# Patient Record
Sex: Female | Born: 1935 | Race: White | Hispanic: No | Marital: Married | Smoking: Never smoker
Health system: Southern US, Community
[De-identification: ages and names within clinical notes are randomized; demographics above are authoritative.]

## PROBLEM LIST (undated history)

## (undated) DIAGNOSIS — I38 Endocarditis, valve unspecified: Secondary | ICD-10-CM

## (undated) DIAGNOSIS — I10 Essential (primary) hypertension: Secondary | ICD-10-CM

## (undated) HISTORY — PX: CATARACT EXTRACTION: SUR2

## (undated) HISTORY — PX: ROTATOR CUFF REPAIR: SHX139

## (undated) HISTORY — PX: BLADDER SURGERY: SHX569

## (undated) HISTORY — PX: LEG SURGERY: SHX1003

## (undated) HISTORY — PX: ABDOMINAL HYSTERECTOMY: SHX81

---

## 1998-06-29 ENCOUNTER — Other Ambulatory Visit: Admission: RE | Admit: 1998-06-29 | Discharge: 1998-06-29 | Payer: Self-pay | Admitting: Gynecology

## 1999-08-21 ENCOUNTER — Other Ambulatory Visit: Admission: RE | Admit: 1999-08-21 | Discharge: 1999-08-21 | Payer: Self-pay | Admitting: Gynecology

## 2000-07-05 ENCOUNTER — Encounter: Payer: Self-pay | Admitting: Orthopedic Surgery

## 2000-07-05 ENCOUNTER — Encounter: Admission: RE | Admit: 2000-07-05 | Discharge: 2000-07-05 | Payer: Self-pay | Admitting: Orthopedic Surgery

## 2000-07-30 ENCOUNTER — Ambulatory Visit (HOSPITAL_BASED_OUTPATIENT_CLINIC_OR_DEPARTMENT_OTHER): Admission: RE | Admit: 2000-07-30 | Discharge: 2000-07-31 | Payer: Self-pay | Admitting: Orthopedic Surgery

## 2000-12-16 ENCOUNTER — Ambulatory Visit (HOSPITAL_BASED_OUTPATIENT_CLINIC_OR_DEPARTMENT_OTHER): Admission: RE | Admit: 2000-12-16 | Discharge: 2000-12-16 | Payer: Self-pay | Admitting: Orthopedic Surgery

## 2001-08-21 ENCOUNTER — Other Ambulatory Visit: Admission: RE | Admit: 2001-08-21 | Discharge: 2001-08-21 | Payer: Self-pay | Admitting: Gynecology

## 2013-11-24 DIAGNOSIS — H8110 Benign paroxysmal vertigo, unspecified ear: Secondary | ICD-10-CM | POA: Diagnosis not present

## 2013-11-24 DIAGNOSIS — H6120 Impacted cerumen, unspecified ear: Secondary | ICD-10-CM | POA: Diagnosis not present

## 2013-11-24 DIAGNOSIS — I10 Essential (primary) hypertension: Secondary | ICD-10-CM | POA: Diagnosis not present

## 2013-12-23 DIAGNOSIS — I059 Rheumatic mitral valve disease, unspecified: Secondary | ICD-10-CM | POA: Diagnosis not present

## 2014-03-08 DIAGNOSIS — L821 Other seborrheic keratosis: Secondary | ICD-10-CM | POA: Diagnosis not present

## 2014-03-24 DIAGNOSIS — M999 Biomechanical lesion, unspecified: Secondary | ICD-10-CM | POA: Diagnosis not present

## 2014-03-24 DIAGNOSIS — I10 Essential (primary) hypertension: Secondary | ICD-10-CM | POA: Diagnosis not present

## 2014-03-24 DIAGNOSIS — IMO0002 Reserved for concepts with insufficient information to code with codable children: Secondary | ICD-10-CM | POA: Diagnosis not present

## 2014-03-24 DIAGNOSIS — M542 Cervicalgia: Secondary | ICD-10-CM | POA: Diagnosis not present

## 2014-03-24 DIAGNOSIS — E782 Mixed hyperlipidemia: Secondary | ICD-10-CM | POA: Diagnosis not present

## 2014-03-25 DIAGNOSIS — IMO0002 Reserved for concepts with insufficient information to code with codable children: Secondary | ICD-10-CM | POA: Diagnosis not present

## 2014-03-25 DIAGNOSIS — M999 Biomechanical lesion, unspecified: Secondary | ICD-10-CM | POA: Diagnosis not present

## 2014-03-30 DIAGNOSIS — M999 Biomechanical lesion, unspecified: Secondary | ICD-10-CM | POA: Diagnosis not present

## 2014-03-30 DIAGNOSIS — IMO0002 Reserved for concepts with insufficient information to code with codable children: Secondary | ICD-10-CM | POA: Diagnosis not present

## 2014-04-01 DIAGNOSIS — M999 Biomechanical lesion, unspecified: Secondary | ICD-10-CM | POA: Diagnosis not present

## 2014-04-01 DIAGNOSIS — IMO0002 Reserved for concepts with insufficient information to code with codable children: Secondary | ICD-10-CM | POA: Diagnosis not present

## 2014-04-06 DIAGNOSIS — IMO0002 Reserved for concepts with insufficient information to code with codable children: Secondary | ICD-10-CM | POA: Diagnosis not present

## 2014-04-06 DIAGNOSIS — M999 Biomechanical lesion, unspecified: Secondary | ICD-10-CM | POA: Diagnosis not present

## 2014-04-20 DIAGNOSIS — M62838 Other muscle spasm: Secondary | ICD-10-CM | POA: Diagnosis not present

## 2014-04-20 DIAGNOSIS — M542 Cervicalgia: Secondary | ICD-10-CM | POA: Diagnosis not present

## 2014-09-26 DIAGNOSIS — Z23 Encounter for immunization: Secondary | ICD-10-CM | POA: Diagnosis not present

## 2014-09-27 DIAGNOSIS — R002 Palpitations: Secondary | ICD-10-CM | POA: Diagnosis not present

## 2014-09-27 DIAGNOSIS — I340 Nonrheumatic mitral (valve) insufficiency: Secondary | ICD-10-CM | POA: Diagnosis not present

## 2014-09-27 DIAGNOSIS — H903 Sensorineural hearing loss, bilateral: Secondary | ICD-10-CM | POA: Diagnosis not present

## 2014-09-27 DIAGNOSIS — E785 Hyperlipidemia, unspecified: Secondary | ICD-10-CM | POA: Diagnosis not present

## 2014-11-03 DIAGNOSIS — E785 Hyperlipidemia, unspecified: Secondary | ICD-10-CM | POA: Diagnosis not present

## 2014-11-03 DIAGNOSIS — Z1211 Encounter for screening for malignant neoplasm of colon: Secondary | ICD-10-CM | POA: Diagnosis not present

## 2014-11-03 DIAGNOSIS — Z1239 Encounter for other screening for malignant neoplasm of breast: Secondary | ICD-10-CM | POA: Diagnosis not present

## 2014-11-03 DIAGNOSIS — R030 Elevated blood-pressure reading, without diagnosis of hypertension: Secondary | ICD-10-CM | POA: Diagnosis not present

## 2014-11-03 DIAGNOSIS — H9201 Otalgia, right ear: Secondary | ICD-10-CM | POA: Diagnosis not present

## 2014-11-03 DIAGNOSIS — E871 Hypo-osmolality and hyponatremia: Secondary | ICD-10-CM | POA: Diagnosis not present

## 2014-11-03 DIAGNOSIS — I340 Nonrheumatic mitral (valve) insufficiency: Secondary | ICD-10-CM | POA: Diagnosis not present

## 2014-11-03 DIAGNOSIS — M8580 Other specified disorders of bone density and structure, unspecified site: Secondary | ICD-10-CM | POA: Diagnosis not present

## 2015-03-15 DIAGNOSIS — L821 Other seborrheic keratosis: Secondary | ICD-10-CM | POA: Diagnosis not present

## 2015-06-29 DIAGNOSIS — H6092 Unspecified otitis externa, left ear: Secondary | ICD-10-CM | POA: Diagnosis not present

## 2015-09-01 DIAGNOSIS — Z1231 Encounter for screening mammogram for malignant neoplasm of breast: Secondary | ICD-10-CM | POA: Diagnosis not present

## 2015-09-01 DIAGNOSIS — Z780 Asymptomatic menopausal state: Secondary | ICD-10-CM | POA: Diagnosis not present

## 2015-09-01 DIAGNOSIS — M810 Age-related osteoporosis without current pathological fracture: Secondary | ICD-10-CM | POA: Diagnosis not present

## 2015-09-14 DIAGNOSIS — R5383 Other fatigue: Secondary | ICD-10-CM | POA: Diagnosis not present

## 2015-09-14 DIAGNOSIS — E785 Hyperlipidemia, unspecified: Secondary | ICD-10-CM | POA: Diagnosis not present

## 2015-09-14 DIAGNOSIS — R946 Abnormal results of thyroid function studies: Secondary | ICD-10-CM | POA: Diagnosis not present

## 2015-09-14 DIAGNOSIS — Z23 Encounter for immunization: Secondary | ICD-10-CM | POA: Diagnosis not present

## 2015-09-14 DIAGNOSIS — M545 Low back pain: Secondary | ICD-10-CM | POA: Diagnosis not present

## 2015-09-14 DIAGNOSIS — R739 Hyperglycemia, unspecified: Secondary | ICD-10-CM | POA: Diagnosis not present

## 2015-09-14 DIAGNOSIS — Z0000 Encounter for general adult medical examination without abnormal findings: Secondary | ICD-10-CM | POA: Diagnosis not present

## 2015-09-28 DIAGNOSIS — Z23 Encounter for immunization: Secondary | ICD-10-CM | POA: Diagnosis not present

## 2015-11-29 DIAGNOSIS — J019 Acute sinusitis, unspecified: Secondary | ICD-10-CM | POA: Diagnosis not present

## 2015-12-17 DIAGNOSIS — N390 Urinary tract infection, site not specified: Secondary | ICD-10-CM | POA: Diagnosis not present

## 2016-03-14 DIAGNOSIS — R7301 Impaired fasting glucose: Secondary | ICD-10-CM | POA: Diagnosis not present

## 2016-03-14 DIAGNOSIS — J309 Allergic rhinitis, unspecified: Secondary | ICD-10-CM | POA: Diagnosis not present

## 2016-03-14 DIAGNOSIS — E785 Hyperlipidemia, unspecified: Secondary | ICD-10-CM | POA: Diagnosis not present

## 2016-04-20 DIAGNOSIS — J069 Acute upper respiratory infection, unspecified: Secondary | ICD-10-CM | POA: Diagnosis not present

## 2016-04-20 DIAGNOSIS — B9789 Other viral agents as the cause of diseases classified elsewhere: Secondary | ICD-10-CM | POA: Diagnosis not present

## 2016-06-21 DIAGNOSIS — H35353 Cystoid macular degeneration, bilateral: Secondary | ICD-10-CM | POA: Diagnosis not present

## 2016-06-21 DIAGNOSIS — Z961 Presence of intraocular lens: Secondary | ICD-10-CM | POA: Diagnosis not present

## 2016-06-21 DIAGNOSIS — H5201 Hypermetropia, right eye: Secondary | ICD-10-CM | POA: Diagnosis not present

## 2016-06-21 DIAGNOSIS — H524 Presbyopia: Secondary | ICD-10-CM | POA: Diagnosis not present

## 2016-06-21 DIAGNOSIS — H52223 Regular astigmatism, bilateral: Secondary | ICD-10-CM | POA: Diagnosis not present

## 2016-09-13 DIAGNOSIS — R5383 Other fatigue: Secondary | ICD-10-CM | POA: Diagnosis not present

## 2016-09-13 DIAGNOSIS — Z0000 Encounter for general adult medical examination without abnormal findings: Secondary | ICD-10-CM | POA: Diagnosis not present

## 2016-09-13 DIAGNOSIS — Z23 Encounter for immunization: Secondary | ICD-10-CM | POA: Diagnosis not present

## 2017-05-06 DIAGNOSIS — I10 Essential (primary) hypertension: Secondary | ICD-10-CM | POA: Diagnosis not present

## 2017-05-07 DIAGNOSIS — L668 Other cicatricial alopecia: Secondary | ICD-10-CM | POA: Diagnosis not present

## 2017-05-07 DIAGNOSIS — L821 Other seborrheic keratosis: Secondary | ICD-10-CM | POA: Diagnosis not present

## 2017-05-07 DIAGNOSIS — D1801 Hemangioma of skin and subcutaneous tissue: Secondary | ICD-10-CM | POA: Diagnosis not present

## 2017-05-09 DIAGNOSIS — J3089 Other allergic rhinitis: Secondary | ICD-10-CM | POA: Diagnosis not present

## 2017-05-09 DIAGNOSIS — E7800 Pure hypercholesterolemia, unspecified: Secondary | ICD-10-CM | POA: Diagnosis not present

## 2017-11-05 DIAGNOSIS — Z23 Encounter for immunization: Secondary | ICD-10-CM | POA: Diagnosis not present

## 2018-10-18 ENCOUNTER — Encounter (HOSPITAL_COMMUNITY): Payer: Self-pay | Admitting: Emergency Medicine

## 2018-10-18 ENCOUNTER — Other Ambulatory Visit: Payer: Self-pay

## 2018-10-18 ENCOUNTER — Emergency Department (HOSPITAL_COMMUNITY): Payer: Medicare Other

## 2018-10-18 ENCOUNTER — Inpatient Hospital Stay (HOSPITAL_COMMUNITY)
Admission: EM | Admit: 2018-10-18 | Discharge: 2018-10-21 | DRG: 603 | Disposition: A | Discharge status: Home / Self Care | Payer: Medicare Other | Attending: Nephrology | Admitting: Nephrology

## 2018-10-18 DIAGNOSIS — L0231 Cutaneous abscess of buttock: Secondary | ICD-10-CM | POA: Diagnosis present

## 2018-10-18 DIAGNOSIS — M549 Dorsalgia, unspecified: Secondary | ICD-10-CM | POA: Diagnosis present

## 2018-10-18 DIAGNOSIS — Z885 Allergy status to narcotic agent status: Secondary | ICD-10-CM | POA: Diagnosis not present

## 2018-10-18 DIAGNOSIS — I10 Essential (primary) hypertension: Secondary | ICD-10-CM | POA: Diagnosis present

## 2018-10-18 DIAGNOSIS — E860 Dehydration: Secondary | ICD-10-CM | POA: Diagnosis present

## 2018-10-18 DIAGNOSIS — E871 Hypo-osmolality and hyponatremia: Secondary | ICD-10-CM | POA: Diagnosis present

## 2018-10-18 DIAGNOSIS — Z79899 Other long term (current) drug therapy: Secondary | ICD-10-CM

## 2018-10-18 DIAGNOSIS — L88 Pyoderma gangrenosum: Secondary | ICD-10-CM | POA: Diagnosis present

## 2018-10-18 DIAGNOSIS — L03317 Cellulitis of buttock: Secondary | ICD-10-CM | POA: Diagnosis present

## 2018-10-18 DIAGNOSIS — Z881 Allergy status to other antibiotic agents status: Secondary | ICD-10-CM | POA: Diagnosis not present

## 2018-10-18 DIAGNOSIS — Z91048 Other nonmedicinal substance allergy status: Secondary | ICD-10-CM

## 2018-10-18 DIAGNOSIS — M1990 Unspecified osteoarthritis, unspecified site: Secondary | ICD-10-CM | POA: Diagnosis present

## 2018-10-18 DIAGNOSIS — Z882 Allergy status to sulfonamides status: Secondary | ICD-10-CM

## 2018-10-18 DIAGNOSIS — R5381 Other malaise: Secondary | ICD-10-CM | POA: Diagnosis not present

## 2018-10-18 HISTORY — DX: Endocarditis, valve unspecified: I38

## 2018-10-18 HISTORY — DX: Essential (primary) hypertension: I10

## 2018-10-18 LAB — BASIC METABOLIC PANEL
Anion gap: 11 (ref 5–15)
BUN: 15 mg/dL (ref 8–23)
CO2: 22 mmol/L (ref 22–32)
Calcium: 9.4 mg/dL (ref 8.9–10.3)
Chloride: 97 mmol/L — ABNORMAL LOW (ref 98–111)
Creatinine, Ser: 0.83 mg/dL (ref 0.44–1.00)
GFR calc Af Amer: >60 mL/min (ref >60)
GFR calc non Af Amer: >60 mL/min (ref >60)
Glucose, Bld: 116 mg/dL — ABNORMAL HIGH (ref 70–99)
Potassium: 4.1 mmol/L (ref 3.5–5.1)
Sodium: 130 mmol/L — ABNORMAL LOW (ref 135–145)

## 2018-10-18 LAB — CBC WITH DIFFERENTIAL/PLATELET
Abs Immature Granulocytes: 0.07 10*3/uL (ref 0.00–0.07)
Basophils Absolute: 0 10*3/uL (ref 0.0–0.1)
Basophils Relative: 0 %
Eosinophils Absolute: 0.2 10*3/uL (ref 0.0–0.5)
Eosinophils Relative: 2 %
HCT: 36 % (ref 36.0–46.0)
Hemoglobin: 11.8 g/dL — ABNORMAL LOW (ref 12.0–15.0)
Immature Granulocytes: 1 %
Lymphocytes Relative: 19 %
Lymphs Abs: 1.7 10*3/uL (ref 0.7–4.0)
MCH: 29.8 pg (ref 26.0–34.0)
MCHC: 32.8 g/dL (ref 30.0–36.0)
MCV: 90.9 fL (ref 80.0–100.0)
Monocytes Absolute: 1.3 10*3/uL — ABNORMAL HIGH (ref 0.1–1.0)
Monocytes Relative: 15 %
Neutro Abs: 5.7 10*3/uL (ref 1.7–7.7)
Neutrophils Relative %: 63 %
Platelets: 299 10*3/uL (ref 150–400)
RBC: 3.96 MIL/uL (ref 3.87–5.11)
RDW: 13.4 % (ref 11.5–15.5)
WBC: 9 10*3/uL (ref 4.0–10.5)
nRBC: 0 % (ref 0.0–0.2)

## 2018-10-18 LAB — I-STAT CHEM 8, ED
BUN: 16 mg/dL (ref 8–23)
CREATININE: 0.7 mg/dL (ref 0.44–1.00)
Calcium, Ion: 1.25 mmol/L (ref 1.15–1.40)
Chloride: 99 mmol/L (ref 98–111)
Glucose, Bld: 115 mg/dL — ABNORMAL HIGH (ref 70–99)
HCT: 36 % (ref 36.0–46.0)
Hemoglobin: 12.2 g/dL (ref 12.0–15.0)
Potassium: 4.1 mmol/L (ref 3.5–5.1)
Sodium: 130 mmol/L — ABNORMAL LOW (ref 135–145)
TCO2: 24 mmol/L (ref 22–32)

## 2018-10-18 LAB — I-STAT CG4 LACTIC ACID, ED: Lactic Acid, Venous: 1.25 mmol/L (ref 0.5–1.9)

## 2018-10-18 MED ORDER — ACETAMINOPHEN 500 MG PO TABS
1000.0000 mg | ORAL_TABLET | Freq: Four times a day (QID) | ORAL | Status: DC | PRN
  Administered 2018-10-19 – 2018-10-21 (×3): 1000 mg via ORAL
  Filled 2018-10-18 (×3): qty 2

## 2018-10-18 MED ORDER — SODIUM CHLORIDE 0.9 % IV SOLN
1.0000 g | Freq: Once | INTRAVENOUS | Status: AC
  Administered 2018-10-18: 1 g via INTRAVENOUS
  Filled 2018-10-18: qty 10

## 2018-10-18 MED ORDER — VITAMIN E 180 MG (400 UNIT) PO CAPS
400.0000 [IU] | ORAL_CAPSULE | Freq: Every day | ORAL | Status: DC
  Administered 2018-10-18 – 2018-10-21 (×4): 400 [IU] via ORAL
  Filled 2018-10-18 (×4): qty 1

## 2018-10-18 MED ORDER — DICLOFENAC SODIUM 75 MG PO TBEC
75.0000 mg | DELAYED_RELEASE_TABLET | Freq: Every day | ORAL | Status: DC | PRN
  Administered 2018-10-18: 75 mg via ORAL
  Filled 2018-10-18 (×2): qty 1

## 2018-10-18 MED ORDER — LISINOPRIL 10 MG PO TABS
10.0000 mg | ORAL_TABLET | Freq: Every day | ORAL | Status: DC
  Administered 2018-10-19: 10 mg via ORAL
  Filled 2018-10-18 (×2): qty 1

## 2018-10-18 MED ORDER — VANCOMYCIN HCL 10 G IV SOLR
1250.0000 mg | Freq: Once | INTRAVENOUS | Status: AC
  Administered 2018-10-18: 1250 mg via INTRAVENOUS
  Filled 2018-10-18: qty 1250

## 2018-10-18 MED ORDER — ONDANSETRON HCL 4 MG/2ML IJ SOLN: 4.0000 mg | Freq: Four times a day (QID) | INTRAMUSCULAR | Status: DC | PRN

## 2018-10-18 MED ORDER — LORATADINE 10 MG PO TABS
10.0000 mg | ORAL_TABLET | Freq: Every day | ORAL | Status: DC
  Administered 2018-10-19 – 2018-10-21 (×3): 10 mg via ORAL
  Filled 2018-10-18 (×3): qty 1

## 2018-10-18 MED ORDER — SODIUM CHLORIDE 0.9 % IV SOLN
INTRAVENOUS | Status: DC
  Administered 2018-10-18 – 2018-10-21 (×4): via INTRAVENOUS

## 2018-10-18 MED ORDER — IBUPROFEN 400 MG PO TABS: 600.0000 mg | ORAL_TABLET | Freq: Four times a day (QID) | ORAL | Status: DC | PRN

## 2018-10-18 MED ORDER — ENOXAPARIN SODIUM 40 MG/0.4ML ~~LOC~~ SOLN
40.0000 mg | SUBCUTANEOUS | Status: DC
  Administered 2018-10-18 – 2018-10-20 (×3): 40 mg via SUBCUTANEOUS
  Filled 2018-10-18 (×3): qty 0.4

## 2018-10-18 MED ORDER — ONDANSETRON HCL 4 MG PO TABS: 4.0000 mg | ORAL_TABLET | Freq: Four times a day (QID) | ORAL | Status: DC | PRN

## 2018-10-18 MED ORDER — IOHEXOL 300 MG/ML  SOLN
100.0000 mL | Freq: Once | INTRAMUSCULAR | Status: AC | PRN
  Administered 2018-10-18: 100 mL via INTRAVENOUS

## 2018-10-18 MED ORDER — CALCIUM CARBONATE-VITAMIN D 500-200 MG-UNIT PO TABS
ORAL_TABLET | Freq: Two times a day (BID) | ORAL | Status: DC
  Administered 2018-10-18 – 2018-10-21 (×6): 1 via ORAL
  Filled 2018-10-18 (×6): qty 1

## 2018-10-18 NOTE — H&P (Signed)
History and Physical   Raven Sampson TLX:[id number] DOB: 1932-04-23 DOA: 10/18/2018  Referring MD/NP/PA: Dr. Tyrone Nine  PCP: Bayard Beaver, PA-C   Patient coming from: Home  Chief Complaint: Rashes at the buttocks  HPI: Raven Sampson is a 84 y.o. female with medical history significant of hypertension who started seeing some rashes in her buttocks for about a month now.  They have been progressively getting worse lately.  Patient went to the urgent care center today where she was seen and evaluated and sent to the ER with suspected cellulitis.  She was seen in the ER and evaluated.  Work-up so far showed no abscess.  Cellulitis suspected and patient is being admitted for IV antibiotics.  Has pain on and off.  It gets worse when she sits down but not relieved by anything.  No sick contacts.  No prior skin lesions or infections.  Has wound that has opened and oozing at the moment  ED Course: Temperature 97.7 blood pressure 140/82 pulse 105 respirate of 18 oxygen sat 97% room air.  White count is 9.0 hemoglobin 12.2 with platelets 299.  Sodium is 130 potassium 4.0 chloride 99 and glucose 115 lactic acid 1.25.  CT abdomen pelvis showed No evidence of anorectal abscess. Small subcutaneous fluid collection adjacent the midline over the left lower gluteal crease measuring 1.0 x 2.2 x 2.3 cm. This may represent a small subcutaneous abscess versus noninfected fluid collection.  Patient initiated on vancomycin and Rocephin as being admitted for treat  Mild colonic diverticulosis.  Review of Systems: As per HPI otherwise 10 point review of systems negative.    Past Medical History:  Diagnosis Date  . Hypertension   . Leaky heart valve     Past Surgical History:  Procedure Laterality Date  . ABDOMINAL HYSTERECTOMY    . BLADDER SURGERY    . CATARACT EXTRACTION    . LEG SURGERY Left   . ROTATOR CUFF REPAIR Right      reports that she has never smoked. She has never used smokeless tobacco. She  reports that she drank alcohol. She reports that she has current or past drug history.  Allergies  Allergen Reactions  . Codeine Nausea And Vomiting and Other (See Comments)    dizziness  . Tape Other (See Comments)    Burning sensation - pls use paper tape  . Ciprofloxacin Rash  . Sulfa Antibiotics Nausea And Vomiting and Rash    No family history on file.   Prior to Admission medications   Medication Sig Start Date End Date Taking? Authorizing Provider  acetaminophen (TYLENOL) 500 MG tablet Take 1,000 mg by mouth every 6 (six) hours as needed for headache (pain).   Yes [provider]  Calcium Carb-Cholecalciferol (CALCIUM 600-D PO) Take 600 mg by mouth 2 (two) times daily.   Yes [provider]  Coenzyme Q10 (COQ10 PO) Take 1 capsule by mouth daily.   Yes [provider]  diclofenac (VOLTAREN) 75 MG EC tablet Take 75 mg by mouth daily as needed (pain).   Yes [provider]  fexofenadine (ALLEGRA) 180 MG tablet Take 180 mg by mouth daily.   Yes [provider]  fluocinolone (SYNALAR) 0.01 % external solution Apply 1 application topically See admin instructions. Apply topically to scalp once daily for two weeks, hold for two weeks and then repeat   Yes [provider]  Glucosamine-Chondroitin (OSTEO BI-FLEX REGULAR STRENGTH PO) Take 1 tablet by mouth 2 (two) times daily.  Yes [provider]  lisinopril (PRINIVIL,ZESTRIL) 10 MG tablet Take 10 mg by mouth daily.   Yes [provider]  Polyvinyl Alcohol-Povidone (REFRESH OP) Place 1 drop into both eyes daily.   Yes [provider]  vitamin E 400 UNIT capsule Take 400 Units by mouth daily.   Yes [provider]  PREDNISONE PO Take 1-6 tablets by mouth See admin instructions. Take 6 tablets by mouth 1st day, then take 5 tablets 2nd day, then take 4 tablets 3rd day, then take 3 tablets 4th day, then take 2 tablets 5th day, then take 1 tablet 6th day,  then stop    [provider]    Physical Exam: Vitals:   10/18/18 1816 10/18/18 1819 10/18/18 1930 10/18/18 2047  BP: (!) 148/82  128/63 (!) 148/69  Pulse: (!) 105  (!) 102 91  Resp: 14  18 16   Temp: 97.7 F (36.5 C)     TempSrc: Oral     SpO2: 97%  98% 98%  Weight:  63 kg    Height:  5' 6.5" (1.689 m)        Constitutional: NAD, calm, comfortable Vitals:   10/18/18 1816 10/18/18 1819 10/18/18 1930 10/18/18 2047  BP: (!) 148/82  128/63 (!) 148/69  Pulse: (!) 105  (!) 102 91  Resp: 14  18 16   Temp: 97.7 F (36.5 C)     TempSrc: Oral     SpO2: 97%  98% 98%  Weight:  63 kg    Height:  5' 6.5" (1.689 m)     Eyes: PERRL, lids and conjunctivae normal ENMT: Mucous membranes are moist. Posterior pharynx clear of any exudate or lesions.Normal dentition.  Neck: normal, supple, no masses, no thyromegaly Respiratory: clear to auscultation bilaterally, no wheezing, no crackles. Normal respiratory effort. No accessory muscle use.  Cardiovascular: Regular rate and rhythm, no murmurs / rubs / gallops. No extremity edema. 2+ pedal pulses. No carotid bruits.  Abdomen: no tenderness, no masses palpated. No hepatosplenomegaly. Bowel sounds positive.  Musculoskeletal: no clubbing / cyanosis. No joint deformity upper and lower extremities. Good ROM, no contractures. Normal muscle tone.  Skin: Bilateral cellulitis of the buttocks around the cleft with right medium as space open wounds oozing serosanguineous fluid Neurologic: CN 2-12 grossly intact. Sensation intact, DTR normal. Strength 5/5 in all 4.  Psychiatric: Normal judgment and insight. Alert and oriented x 3. Normal mood.     Labs on Admission: I have personally reviewed following labs and imaging studies  CBC: Recent Labs  Lab 10/18/18 1910 10/18/18 1925  WBC 9.0  --   NEUTROABS 5.7  --   HGB 11.8* 12.2  HCT 36.0 36.0  MCV 90.9  --   PLT 299  --    Basic Metabolic Panel: Recent Labs  Lab 10/18/18 1910  10/18/18 1925  NA 130* 130*  K 4.1 4.1  CL 97* 99  CO2 22  --   GLUCOSE 116* 115*  BUN 15 16  CREATININE 0.83 0.70  CALCIUM 9.4  --    GFR: Estimated Creatinine Clearance: 50 mL/min (by C-G formula based on SCr of 0.7 mg/dL). Liver Function Tests: No results for input(s): AST, ALT, ALKPHOS, BILITOT, PROT, ALBUMIN in the last 168 hours. No results for input(s): LIPASE, AMYLASE in the last 168 hours. No results for input(s): AMMONIA in the last 168 hours. Coagulation Profile: No results for input(s): INR, PROTIME in the last 168 hours. Cardiac Enzymes: No results for input(s): CKTOTAL, CKMB,  CKMBINDEX, TROPONINI in the last 168 hours. BNP (last 3 results) No results for input(s): PROBNP in the last 8760 hours. HbA1C: No results for input(s): HGBA1C in the last 72 hours. CBG: No results for input(s): GLUCAP in the last 168 hours. Lipid Profile: No results for input(s): CHOL, HDL, LDLCALC, TRIG, CHOLHDL, LDLDIRECT in the last 72 hours. Thyroid Function Tests: No results for input(s): TSH, T4TOTAL, FREET4, T3FREE, THYROIDAB in the last 72 hours. Anemia Panel: No results for input(s): VITAMINB12, FOLATE, FERRITIN, TIBC, IRON, RETICCTPCT in the last 72 hours. Urine analysis: No results found for: COLORURINE, APPEARANCEUR, LABSPEC, PHURINE, GLUCOSEU, HGBUR, BILIRUBINUR, KETONESUR, PROTEINUR, UROBILINOGEN, NITRITE, LEUKOCYTESUR Sepsis Labs: @LABRCNTIP (procalcitonin:4,lacticidven:4) )No results found for this or any previous visit (from the past 240 hour(s)).   Radiological Exams on Admission: Ct Pelvis W Contrast  Result Date: 10/18/2018 CLINICAL DATA:  Anal rectal abscess 1 week. EXAM: CT PELVIS WITH CONTRAST TECHNIQUE: Multidetector CT imaging of the pelvis was performed using the standard protocol following the bolus administration of intravenous contrast. CONTRAST:  166mL OMNIPAQUE IOHEXOL 300 MG/ML  SOLN COMPARISON:  None. FINDINGS: Urinary Tract: Partially visualized 6.0 cm  left renal cyst. Left renal hilum oriented anteriorly. Visualized portions of the ureters and bladder are unremarkable. Bowel: Minimal colonic diverticulosis. Appendix not visualized. Visualized portions of the small bowel are normal. Vascular/Lymphatic: Mild calcified plaque over the distal abdominal aorta. No adenopathy. Reproductive:  Previous hysterectomy. Other: There is a small subcutaneous fluid collection over the lower left gluteal region immediately left of midline below the level of the ischial tuberosities. This measures 1 x 2.2 x 2.3 cm in transverse, AP and craniocaudal dimension. This may represent a subcutaneous infected versus noninfected fluid collection. No evidence of anal rectal abscess. Musculoskeletal: Moderate degenerate change of the lumbosacral spine with significant disc disease over the L3-4, L4-5 and L5-S1 levels. Mild degenerate change of the hips. IMPRESSION: No evidence of anorectal abscess. Small subcutaneous fluid collection adjacent the midline over the left lower gluteal crease measuring 1.0 x 2.2 x 2.3 cm. This may represent a small subcutaneous abscess versus noninfected fluid collection. Mild colonic diverticulosis. Aortic Atherosclerosis (ICD10-I70.0). Partially visualized 6 cm left renal cyst. Electronically Signed   By: Marin Olp M.D.   On: 10/18/2018 20:09    Assessment/Plan Principal Problem:   Cellulitis of buttock Active Problems:   Benign essential HTN   Hyponatremia     #1 cellulitis of buttocks bilaterally: Suspected infection versus inflammation.  Based on findings it looks like infection with some mild fluid collection with initial abscess.  Patient will be admitted and initiated on IV antibiotics.  May get surgical consultation for possible I&D.  Wound culture has been obtained and will wait for [place] sensitivities and identification.  #2 hypertension: Continue lisinopril from home dose  #3 hyponatremia: Euvolemic it appears.  Monitor sodium.   Could be chronic hyponatremia.   DVT prophylaxis: Lovenox Code Status: Full code Family Communication: Daughter and grandson at bedside Disposition Plan: Home Consults called: None Admission status: Observation  Severity of Illness: The appropriate patient status for this patient is OBSERVATION. Observation status is judged to be reasonable and necessary in order to provide the required intensity of service to ensure the patient's safety. The patient's presenting symptoms, physical exam findings, and initial radiographic and laboratory data in the context of their medical condition is felt to place them at decreased risk for further clinical deterioration. Furthermore, it is anticipated that the patient will be medically stable for discharge from the  hospital within 2 midnights of admission. The following factors support the patient status of observation.   " The patient's presenting symptoms include rashes at the buttocks. " The physical exam findings include obvious cellulitis on the back. " The initial radiographic and laboratory data are CT showed no evidence of abscess.     Barbette Merino MD [facility] Pager [phone]  If 7PM-7AM, please contact night-coverage [email or web address] Password [email or web address]  10/18/2018, 9:05 PM

## 2018-10-18 NOTE — ED Provider Notes (Signed)
[facility] EMERGENCY DEPARTMENT Provider Note   CSN: [id number] Arrival date & time: 10/18/18  1810     History   Chief Complaint Chief Complaint  Patient presents with  . Abscess    HPI Raven Sampson is a 84 y.o. female.  84 yo F with a chief complaint of a spot to her buttock.  Is been going on for about a month but slowly getting worse.  She denies injury.  Has not seen a medical care prior to this.  Past week it got significantly worse.  She was at urgent care earlier today and was sent here for evaluation.  The history is provided by the patient and a relative.  Abscess  Location:  Pelvis Pelvic abscess location:  R buttock and L buttock Size:  Quarter Abscess quality: draining, fluctuance, induration, itching, painful, redness, warmth and weeping   Red streaking: no   Duration:  1 month Progression:  Worsening Pain details:    Quality:  Sharp and shooting   Severity:  Moderate   Duration:  1 month   Timing:  Constant   Progression:  Worsening Chronicity:  New Context: not diabetes and not immunosuppression   Relieved by:  Nothing Worsened by:  Nothing Ineffective treatments:  None tried Associated symptoms: no fever, no headaches, no nausea and no vomiting     Past Medical History:  Diagnosis Date  . Hypertension   . Leaky heart valve     Patient Active Problem List   Diagnosis Date Noted  . Cellulitis of buttock 10/18/2018  . Benign essential HTN 10/18/2018  . Hyponatremia 10/18/2018    Past Surgical History:  Procedure Laterality Date  . ABDOMINAL HYSTERECTOMY    . BLADDER SURGERY    . CATARACT EXTRACTION    . LEG SURGERY Left   . ROTATOR CUFF REPAIR Right      OB History   None      Home Medications    Prior to Admission medications   Medication Sig Start Date End Date Taking? Authorizing Provider  acetaminophen (TYLENOL) 500 MG tablet Take 1,000 mg by mouth every 6 (six) hours as needed for headache (pain).    Yes [provider]  Calcium Carb-Cholecalciferol (CALCIUM 600-D PO) Take 600 mg by mouth 2 (two) times daily.   Yes [provider]  Coenzyme Q10 (COQ10 PO) Take 1 capsule by mouth daily.   Yes [provider]  diclofenac (VOLTAREN) 75 MG EC tablet Take 75 mg by mouth daily as needed (pain).   Yes [provider]  fexofenadine (ALLEGRA) 180 MG tablet Take 180 mg by mouth daily.   Yes [provider]  fluocinolone (SYNALAR) 0.01 % external solution Apply 1 application topically See admin instructions. Apply topically to scalp once daily for two weeks, hold for two weeks and then repeat   Yes [provider]  Glucosamine-Chondroitin (OSTEO BI-FLEX REGULAR STRENGTH PO) Take 1 tablet by mouth 2 (two) times daily.   Yes [provider]  lisinopril (PRINIVIL,ZESTRIL) 10 MG tablet Take 10 mg by mouth daily.   Yes [provider]  Polyvinyl Alcohol-Povidone (REFRESH OP) Place 1 drop into both eyes daily.   Yes [provider]  vitamin E 400 UNIT capsule Take 400 Units by mouth daily.   Yes [provider]  PREDNISONE PO Take 1-6 tablets by mouth See admin instructions. Take 6 tablets by mouth 1st day, then take 5 tablets 2nd day, then take 4 tablets 3rd  day, then take 3 tablets 4th day, then take 2 tablets 5th day, then take 1 tablet 6th day, then stop    [provider]    Family History No family history on file.  Social History Social History   Tobacco Use  . Smoking status: Never Smoker  . Smokeless tobacco: Never Used  Substance Use Topics  . Alcohol use: Not Currently  . Drug use: Not Currently     Allergies   Codeine; Tape; Ciprofloxacin; and Sulfa antibiotics   Review of Systems Review of Systems  Constitutional: Negative for chills and fever.  HENT: Negative for congestion and rhinorrhea.   Eyes: Negative for redness and visual disturbance.  Respiratory: Negative for shortness of  breath and wheezing.   Cardiovascular: Negative for chest pain and palpitations.  Gastrointestinal: Negative for nausea and vomiting.  Genitourinary: Negative for dysuria and urgency.  Musculoskeletal: Negative for arthralgias and myalgias.  Skin: Positive for wound. Negative for pallor.  Neurological: Negative for dizziness and headaches.     Physical Exam Updated Vital Signs BP (!) 148/69 (BP Location: Right Arm)   Pulse 91   Temp 97.7 F (36.5 C) (Oral)   Resp 16   Ht 5' 6.5" (1.689 m)   Wt 63 kg   SpO2 98%   BMI 22.10 kg/m   Physical Exam  Constitutional: She is oriented to person, place, and time. She appears well-developed and well-nourished. No distress.  HENT:  Head: Normocephalic and atraumatic.  Eyes: Pupils are equal, round, and reactive to light. EOM are normal.  Neck: Normal range of motion. Neck supple.  Cardiovascular: Normal rate and regular rhythm. Exam reveals no gallop and no friction rub.  No murmur heard. Pulmonary/Chest: Effort normal. She has no wheezes. She has no rales.  Abdominal: Soft. She exhibits no distension. There is no tenderness.  Musculoskeletal: She exhibits no edema or tenderness.  Neurological: She is alert and oriented to person, place, and time.  Skin: Skin is warm and dry. She is not diaphoretic.  2 ulcerated lesions that are symmetrical to the buttock there is no continuation between them there is purulent drainage some induration no palpable fluctuance  Psychiatric: She has a normal mood and affect. Her behavior is normal.  Nursing note and vitals reviewed.    ED Treatments / Results  Labs (all labs ordered are listed, but only abnormal results are displayed) Labs Reviewed  CBC WITH DIFFERENTIAL/PLATELET - Abnormal; Notable for the following components:      Result Value   Hemoglobin 11.8 (*)    Monocytes Absolute 1.3 (*)    All other components within normal limits  BASIC METABOLIC PANEL - Abnormal; Notable for the  following components:   Sodium 130 (*)    Chloride 97 (*)    Glucose, Bld 116 (*)    All other components within normal limits  I-STAT CHEM 8, ED - Abnormal; Notable for the following components:   Sodium 130 (*)    Glucose, Bld 115 (*)    All other components within normal limits  AEROBIC CULTURE (SUPERFICIAL SPECIMEN)  I-STAT CG4 LACTIC ACID, ED  I-STAT CG4 LACTIC ACID, ED    EKG None  Radiology Ct Pelvis W Contrast  Result Date: 10/18/2018 CLINICAL DATA:  Anal rectal abscess 1 week. EXAM: CT PELVIS WITH CONTRAST TECHNIQUE: Multidetector CT imaging of the pelvis was performed using the standard protocol following the bolus administration of intravenous contrast. CONTRAST:  130mL OMNIPAQUE IOHEXOL 300 MG/ML  SOLN COMPARISON:  None. FINDINGS: Urinary Tract: Partially visualized 6.0 cm left renal cyst. Left renal hilum oriented anteriorly. Visualized portions of the ureters and bladder are unremarkable. Bowel: Minimal colonic diverticulosis. Appendix not visualized. Visualized portions of the small bowel are normal. Vascular/Lymphatic: Mild calcified plaque over the distal abdominal aorta. No adenopathy. Reproductive:  Previous hysterectomy. Other: There is a small subcutaneous fluid collection over the lower left gluteal region immediately left of midline below the level of the ischial tuberosities. This measures 1 x 2.2 x 2.3 cm in transverse, AP and craniocaudal dimension. This may represent a subcutaneous infected versus noninfected fluid collection. No evidence of anal rectal abscess. Musculoskeletal: Moderate degenerate change of the lumbosacral spine with significant disc disease over the L3-4, L4-5 and L5-S1 levels. Mild degenerate change of the hips. IMPRESSION: No evidence of anorectal abscess. Small subcutaneous fluid collection adjacent the midline over the left lower gluteal crease measuring 1.0 x 2.2 x 2.3 cm. This may represent a small subcutaneous abscess versus noninfected fluid  collection. Mild colonic diverticulosis. Aortic Atherosclerosis (ICD10-I70.0). Partially visualized 6 cm left renal cyst. Electronically Signed   By: Marin Olp M.D.   On: 10/18/2018 20:09    Procedures Procedures (including critical care time)  Medications Ordered in ED Medications  vancomycin (VANCOCIN) 1,250 mg in sodium chloride 0.9 % 250 mL IVPB (1,250 mg Intravenous New Bag/Given 10/18/18 2050)  cefTRIAXone (ROCEPHIN) 1 g in sodium chloride 0.9 % 100 mL IVPB (0 g Intravenous Stopped 10/18/18 2045)  iohexol (OMNIPAQUE) 300 MG/ML solution 100 mL (100 mLs Intravenous Contrast Given 10/18/18 1941)     Initial Impression / Assessment and Plan / ED Course  I have reviewed the triage vital signs and the nursing notes.  Pertinent labs & imaging results that were available during my care of the patient were reviewed by me and considered in my medical decision making (see chart for details).     84 yo F with a chief complaint of wounds to her skin.  She said it started as a small dot and has regressed significantly.  My exam the area is ulcerated with purulent drainage.  Will obtain a CT scan to assess for further depth into the tissue, will give antibiotics.  CT with superficial involvement.  Started on vanc and rocephin.  Admit.   The patients results and plan were reviewed and discussed.   Any x-rays performed were independently reviewed by myself.   Differential diagnosis were considered with the presenting HPI.  Medications  vancomycin (VANCOCIN) 1,250 mg in sodium chloride 0.9 % 250 mL IVPB (1,250 mg Intravenous New Bag/Given 10/18/18 2050)  cefTRIAXone (ROCEPHIN) 1 g in sodium chloride 0.9 % 100 mL IVPB (0 g Intravenous Stopped 10/18/18 2045)  iohexol (OMNIPAQUE) 300 MG/ML solution 100 mL (100 mLs Intravenous Contrast Given 10/18/18 1941)    Vitals:   10/18/18 1816 10/18/18 1819 10/18/18 1930 10/18/18 2047  BP: (!) 148/82  128/63 (!) 148/69  Pulse: (!) 105  (!) 102 91    Resp: 14  18 16   Temp: 97.7 F (36.5 C)     TempSrc: Oral     SpO2: 97%  98% 98%  Weight:  63 kg    Height:  5' 6.5" (1.689 m)      Final diagnoses:  Cellulitis and abscess of buttock    Admission/ observation were discussed with the admitting physician, patient and/or family and they are comfortable with the plan.    Final Clinical Impressions(s) / ED Diagnoses   Final diagnoses:  Cellulitis and abscess of buttock    ED Discharge Orders    None       Deno Etienne, [place] 10/18/18 2129

## 2018-10-18 NOTE — ED Notes (Signed)
Patient transported to CT 

## 2018-10-18 NOTE — ED Notes (Signed)
ED Provider at bedside. 

## 2018-10-18 NOTE — ED Triage Notes (Signed)
Pt reports abscess to buttock on R side x1 week, sent to ED by [facility]. Reports some drainage.

## 2018-10-18 NOTE — ED Notes (Signed)
Garba, MD at bedside 

## 2018-10-19 DIAGNOSIS — E871 Hypo-osmolality and hyponatremia: Secondary | ICD-10-CM

## 2018-10-19 DIAGNOSIS — I10 Essential (primary) hypertension: Secondary | ICD-10-CM

## 2018-10-19 DIAGNOSIS — E860 Dehydration: Secondary | ICD-10-CM

## 2018-10-19 DIAGNOSIS — L0231 Cutaneous abscess of buttock: Principal | ICD-10-CM

## 2018-10-19 LAB — CBC
HEMATOCRIT: 30.7 % — AB (ref 36.0–46.0)
Hemoglobin: 10.1 g/dL — ABNORMAL LOW (ref 12.0–15.0)
MCH: 29.6 pg (ref 26.0–34.0)
MCHC: 32.9 g/dL (ref 30.0–36.0)
MCV: 90 fL (ref 80.0–100.0)
Platelets: 256 10*3/uL (ref 150–400)
RBC: 3.41 MIL/uL — ABNORMAL LOW (ref 3.87–5.11)
RDW: 13.3 % (ref 11.5–15.5)
WBC: 8.4 10*3/uL (ref 4.0–10.5)
nRBC: 0 % (ref 0.0–0.2)

## 2018-10-19 LAB — COMPREHENSIVE METABOLIC PANEL
ALT: 20 U/L (ref 0–44)
AST: 20 U/L (ref 15–41)
Albumin: 2.5 g/dL — ABNORMAL LOW (ref 3.5–5.0)
Alkaline Phosphatase: 66 U/L (ref 38–126)
Anion gap: 10 (ref 5–15)
BILIRUBIN TOTAL: 0.5 mg/dL (ref 0.3–1.2)
BUN: 12 mg/dL (ref 8–23)
CHLORIDE: 99 mmol/L (ref 98–111)
CO2: 22 mmol/L (ref 22–32)
Calcium: 9 mg/dL (ref 8.9–10.3)
Creatinine, Ser: 0.84 mg/dL (ref 0.44–1.00)
GFR calc Af Amer: >60 mL/min (ref >60)
Glucose, Bld: 102 mg/dL — ABNORMAL HIGH (ref 70–99)
POTASSIUM: 4 mmol/L (ref 3.5–5.1)
Sodium: 131 mmol/L — ABNORMAL LOW (ref 135–145)
Total Protein: 5.5 g/dL — ABNORMAL LOW (ref 6.5–8.1)

## 2018-10-19 MED ORDER — SODIUM CHLORIDE 0.9 % IV SOLN
1.0000 g | INTRAVENOUS | Status: DC
  Filled 2018-10-19: qty 10

## 2018-10-19 MED ORDER — SODIUM CHLORIDE 0.9 % IV SOLN
1.0000 g | INTRAVENOUS | Status: DC
  Administered 2018-10-19: 1 g via INTRAVENOUS
  Filled 2018-10-19: qty 10

## 2018-10-19 MED ORDER — VANCOMYCIN HCL IN DEXTROSE 1-5 GM/200ML-% IV SOLN
1000.0000 mg | INTRAVENOUS | Status: DC
  Administered 2018-10-19 – 2018-10-20 (×2): 1000 mg via INTRAVENOUS
  Filled 2018-10-19 (×3): qty 200

## 2018-10-19 MED ORDER — METRONIDAZOLE IN NACL 5-0.79 MG/ML-% IV SOLN
500.0000 mg | Freq: Three times a day (TID) | INTRAVENOUS | Status: DC
  Administered 2018-10-19 – 2018-10-20 (×3): 500 mg via INTRAVENOUS
  Filled 2018-10-19 (×2): qty 100

## 2018-10-19 NOTE — Progress Notes (Signed)
PROGRESS NOTE    Raven Sampson   SFK:[id number]  DOB: 1931-12-16  DOA: 10/18/2018 PCP: Bayard Beaver, PA-C   Brief Narrative:  Raven Sampson   is a 84 y.o. female with medical history significant of hypertension who started seeing some rashes in her buttocks for about a month now.  They have been progressively getting worse lately.  Patient went to the urgent care center today where she was seen and evaluated and sent to the ER with suspected cellulitis.  She was seen in the ER and evaluated.     CT abdomen pelvis showed No evidence of anorectal abscess. Small subcutaneous fluid collection adjacent the midline over the left lower gluteal crease measuring 1.0 x 2.2 x 2.3 cm. This may represent a small subcutaneous abscess versus noninfected fluid collection. WBC 9.0, no fever Sodium 130  Subjective: Pain is much better today.    Assessment & Plan:   Principal Problem:   Cellulitis and draining abscess of buttocks -recently on Prednisone for a back injury - completed it last week- appears to have started as a pressure injury which secondarily became infected - still has a large amount of drainage and needs IVF - gen surgery consulted- no need for debridement in OR- wound RN consulted - cover for polymicrobial organisms for now due to proximity to rectum and likely stool contamination  Active Problems:   Hyponatremia/ dehydration - cont slow IVF- appetite poor- Bmet tomorrow    Benign essential HTN - Lisinopril   DVT prophylaxis: LOvenox Code Status: Full code Family Communication: daughter Disposition Plan: f/u PT eval, watch for improvement in drainage- she is currently staying with her daughter Consultants:   gen surgery Procedures:   none Antimicrobials:  Anti-infectives (From admission, onward)   Start     Dose/Rate Route Frequency Ordered Stop   10/18/18 1900  vancomycin (VANCOCIN) 1,250 mg in sodium chloride 0.9 % 250 mL IVPB     1,250 mg 166.7 mL/hr over 90  Minutes Intravenous  Once 10/18/18 1849 10/18/18 2220   10/18/18 1900  cefTRIAXone (ROCEPHIN) 1 g in sodium chloride 0.9 % 100 mL IVPB     1 g 200 mL/hr over 30 Minutes Intravenous  Once 10/18/18 1849 10/18/18 2045       Objective: Vitals:   10/18/18 2047 10/18/18 2100 10/18/18 2210 10/19/18 0501  BP: (!) 148/69 (!) 145/75 (!) 152/74 112/67  Pulse: 91 89 89 80  Resp: 16  16 15   Temp:   97.9 F (36.6 C) 98.4 F (36.9 C)  TempSrc:   Oral Oral  SpO2: 98% 97% 100% 97%  Weight:      Height:        Intake/Output Summary (Last 24 hours) at 10/19/2018 0917 Last data filed at 10/19/2018 0300 Gross per 24 hour  Intake 290.86 ml  Output -  Net 290.86 ml   Filed Weights   10/18/18 1819  Weight: 63 kg    Examination: General exam: Appears comfortable  HEENT: PERRLA, oral mucosa moist, no sclera icterus or thrush Respiratory system: Clear to auscultation. Respiratory effort normal. Cardiovascular system: S1 & S2 heard, RRR.   Gastrointestinal system: Abdomen soft, non-tender, nondistended. Normal bowel sounds. Central nervous system: Alert and oriented. No focal neurological deficits. Extremities: No cyanosis, clubbing or edema Skin: No rashes or ulcers Psychiatry:  Mood & affect appropriate.       Data Reviewed: I have personally reviewed following labs and imaging studies  CBC: Recent Labs  Lab 10/18/18 1910  10/18/18 1925 10/19/18 0230  WBC 9.0  --  8.4  NEUTROABS 5.7  --   --   HGB 11.8* 12.2 10.1*  HCT 36.0 36.0 30.7*  MCV 90.9  --  90.0  PLT 299  --  734   Basic Metabolic Panel: Recent Labs  Lab 10/18/18 1910 10/18/18 1925 10/19/18 0230  NA 130* 130* 131*  K 4.1 4.1 4.0  CL 97* 99 99  CO2 22  --  22  GLUCOSE 116* 115* 102*  BUN 15 16 12   CREATININE 0.83 0.70 0.84  CALCIUM 9.4  --  9.0   GFR: Estimated Creatinine Clearance: 47.6 mL/min (by C-G formula based on SCr of 0.84 mg/dL). Liver Function Tests: Recent Labs  Lab 10/19/18 0230  AST 20    ALT 20  ALKPHOS 66  BILITOT 0.5  PROT 5.5*  ALBUMIN 2.5*   No results for input(s): LIPASE, AMYLASE in the last 168 hours. No results for input(s): AMMONIA in the last 168 hours. Coagulation Profile: No results for input(s): INR, PROTIME in the last 168 hours. Cardiac Enzymes: No results for input(s): CKTOTAL, CKMB, CKMBINDEX, TROPONINI in the last 168 hours. BNP (last 3 results) No results for input(s): PROBNP in the last 8760 hours. HbA1C: No results for input(s): HGBA1C in the last 72 hours. CBG: No results for input(s): GLUCAP in the last 168 hours. Lipid Profile: No results for input(s): CHOL, HDL, LDLCALC, TRIG, CHOLHDL, LDLDIRECT in the last 72 hours. Thyroid Function Tests: No results for input(s): TSH, T4TOTAL, FREET4, T3FREE, THYROIDAB in the last 72 hours. Anemia Panel: No results for input(s): VITAMINB12, FOLATE, FERRITIN, TIBC, IRON, RETICCTPCT in the last 72 hours. Urine analysis: No results found for: COLORURINE, APPEARANCEUR, LABSPEC, PHURINE, GLUCOSEU, HGBUR, BILIRUBINUR, KETONESUR, PROTEINUR, UROBILINOGEN, NITRITE, LEUKOCYTESUR Sepsis Labs: @LABRCNTIP (procalcitonin:4,lacticidven:4) ) Recent Results (from the past 240 hour(s))  Wound or Superficial Culture     Status: None (Preliminary result)   Collection Time: 10/18/18  9:29 PM  Result Value Ref Range Status   Specimen Description BUTTOCKS  Final   Special Requests NONE  Final   Gram Stain   Final    ABUNDANT WBC PRESENT, PREDOMINANTLY PMN ABUNDANT GRAM POSITIVE COCCI Performed at [facility], [place] [address], [place], [place] [address]    Culture PENDING  Incomplete   Report Status PENDING  Incomplete         Radiology Studies: Ct Pelvis W Contrast  Result Date: 10/18/2018 CLINICAL DATA:  Anal rectal abscess 1 week. EXAM: CT PELVIS WITH CONTRAST TECHNIQUE: Multidetector CT imaging of the pelvis was performed using the standard protocol following the bolus administration of intravenous  contrast. CONTRAST:  150mL OMNIPAQUE IOHEXOL 300 MG/ML  SOLN COMPARISON:  None. FINDINGS: Urinary Tract: Partially visualized 6.0 cm left renal cyst. Left renal hilum oriented anteriorly. Visualized portions of the ureters and bladder are unremarkable. Bowel: Minimal colonic diverticulosis. Appendix not visualized. Visualized portions of the small bowel are normal. Vascular/Lymphatic: Mild calcified plaque over the distal abdominal aorta. No adenopathy. Reproductive:  Previous hysterectomy. Other: There is a small subcutaneous fluid collection over the lower left gluteal region immediately left of midline below the level of the ischial tuberosities. This measures 1 x 2.2 x 2.3 cm in transverse, AP and craniocaudal dimension. This may represent a subcutaneous infected versus noninfected fluid collection. No evidence of anal rectal abscess. Musculoskeletal: Moderate degenerate change of the lumbosacral spine with significant disc disease over the L3-4, L4-5 and L5-S1 levels. Mild degenerate change of the  hips. IMPRESSION: No evidence of anorectal abscess. Small subcutaneous fluid collection adjacent the midline over the left lower gluteal crease measuring 1.0 x 2.2 x 2.3 cm. This may represent a small subcutaneous abscess versus noninfected fluid collection. Mild colonic diverticulosis. Aortic Atherosclerosis (ICD10-I70.0). Partially visualized 6 cm left renal cyst. Electronically Signed   By: Marin Olp M.D.   On: 10/18/2018 20:09      Scheduled Meds: . calcium-vitamin D   Oral BID  . enoxaparin (LOVENOX) injection  40 mg Subcutaneous Q24H  . lisinopril  10 mg Oral Daily  . loratadine  10 mg Oral Daily  . vitamin E  400 Units Oral Daily   Continuous Infusions: . sodium chloride 75 mL/hr at 10/19/18 0300     LOS: 1 day    Time spent in minutes: 35    Debbe Odea, MD [facility] Pager: [email or web address] Password [facility] 10/19/2018, 9:17 AM

## 2018-10-19 NOTE — Consult Note (Signed)
CC: Consult by Dr. Wynelle Cleveland for gluteal wounds and possible debridemtn  HPI: Raven DIRKS is an 84 y.o. female with newly diangosed HTN, DJD - recently completed steroid tape for "back pain" 2 wks ago presented to ED overnight with progressive buttock wounds over the last month. Started as small punctate area that has opened, drained and grown in size over last month. She reports pain related to this but denies any fever/chills/malaise. She reports being quite active at home - denies prolonged sitting or spending time in bed until last week when wounds became increasingly large.  Past Medical History:  Diagnosis Date  . Hypertension   . Leaky heart valve   DJD/back pain - controlled with steroid tapers  Past Surgical History:  Procedure Laterality Date  . ABDOMINAL HYSTERECTOMY    . BLADDER SURGERY    . CATARACT EXTRACTION    . LEG SURGERY Left   . ROTATOR CUFF REPAIR Right    Reports family hx of "boils" but denies any known cancers in her family  Social:  reports that she has never smoked. She has never used smokeless tobacco. She reports that she drank alcohol. She reports that she has current or past drug history.  Allergies:  Allergies  Allergen Reactions  . Codeine Nausea And Vomiting and Other (See Comments)    dizziness  . Tape Other (See Comments)    Burning sensation - pls use paper tape  . Ciprofloxacin Rash  . Sulfa Antibiotics Nausea And Vomiting and Rash    Medications: I have reviewed the patient's current medications.  Results for orders placed or performed during the hospital encounter of 10/18/18 (from the past 48 hour(s))  CBC with Differential     Status: Abnormal   Collection Time: 10/18/18  7:10 PM  Result Value Ref Range   WBC 9.0 4.0 - 10.5 K/uL   RBC 3.96 3.87 - 5.11 MIL/uL   Hemoglobin 11.8 (L) 12.0 - 15.0 g/dL   HCT 36.0 36.0 - 46.0 %   MCV 90.9 80.0 - 100.0 fL   MCH 29.8 26.0 - 34.0 pg   MCHC 32.8 30.0 - 36.0 g/dL   RDW 13.4 11.5 - 15.5 %     Platelets 299 150 - 400 K/uL   nRBC 0.0 0.0 - 0.2 %   Neutrophils Relative % 63 %   Neutro Abs 5.7 1.7 - 7.7 K/uL   Lymphocytes Relative 19 %   Lymphs Abs 1.7 0.7 - 4.0 K/uL   Monocytes Relative 15 %   Monocytes Absolute 1.3 (H) 0.1 - 1.0 K/uL   Eosinophils Relative 2 %   Eosinophils Absolute 0.2 0.0 - 0.5 K/uL   Basophils Relative 0 %   Basophils Absolute 0.0 0.0 - 0.1 K/uL   Immature Granulocytes 1 %   Abs Immature Granulocytes 0.07 0.00 - 0.07 K/uL    Comment: Performed at [facility], [address] [place], [place] [address]  Basic metabolic panel     Status: Abnormal   Collection Time: 10/18/18  7:10 PM  Result Value Ref Range   Sodium 130 (L) 135 - 145 mmol/L   Potassium 4.1 3.5 - 5.1 mmol/L   Chloride 97 (L) 98 - 111 mmol/L   CO2 22 22 - 32 mmol/L   Glucose, Bld 116 (H) 70 - 99 mg/dL   BUN 15 8 - 23 mg/dL   Creatinine, Ser 0.83 0.44 - 1.00 mg/dL   Calcium 9.4 8.9 - 10.3 mg/dL   GFR calc  non Af Amer >60 >60 mL/min   GFR calc Af Amer >60 >60 mL/min   Anion gap 11 5 - 15    Comment: Performed at [facility] [address] [place], [place] [address]  I-Stat CG4 Lactic Acid, ED     Status: None   Collection Time: 10/18/18  7:25 PM  Result Value Ref Range   Lactic Acid, Venous 1.25 0.5 - 1.9 mmol/L  I-Stat Chem 8, ED     Status: Abnormal   Collection Time: 10/18/18  7:25 PM  Result Value Ref Range   Sodium 130 (L) 135 - 145 mmol/L   Potassium 4.1 3.5 - 5.1 mmol/L   Chloride 99 98 - 111 mmol/L   BUN 16 8 - 23 mg/dL   Creatinine, Ser 0.70 0.44 - 1.00 mg/dL   Glucose, Bld 115 (H) 70 - 99 mg/dL   Calcium, Ion 1.25 1.15 - 1.40 mmol/L   TCO2 24 22 - 32 mmol/L   Hemoglobin 12.2 12.0 - 15.0 g/dL   HCT 36.0 36.0 - 46.0 %  Wound or Superficial Culture     Status: None (Preliminary result)   Collection Time: 10/18/18  9:29 PM  Result Value Ref Range   Specimen Description BUTTOCKS    Special Requests NONE    Gram Stain      ABUNDANT WBC PRESENT,  PREDOMINANTLY PMN ABUNDANT GRAM POSITIVE COCCI Performed at [facility], [place] [address] [place], [place] [address]    Culture PENDING    Report Status PENDING   Comprehensive metabolic panel     Status: Abnormal   Collection Time: 10/19/18  2:30 AM  Result Value Ref Range   Sodium 131 (L) 135 - 145 mmol/L   Potassium 4.0 3.5 - 5.1 mmol/L   Chloride 99 98 - 111 mmol/L   CO2 22 22 - 32 mmol/L   Glucose, Bld 102 (H) 70 - 99 mg/dL   BUN 12 8 - 23 mg/dL   Creatinine, Ser 0.84 0.44 - 1.00 mg/dL   Calcium 9.0 8.9 - 10.3 mg/dL   Total Protein 5.5 (L) 6.5 - 8.1 g/dL   Albumin 2.5 (L) 3.5 - 5.0 g/dL   AST 20 15 - 41 U/L   ALT 20 0 - 44 U/L   Alkaline Phosphatase 66 38 - 126 U/L   Total Bilirubin 0.5 0.3 - 1.2 mg/dL   GFR calc non Af Amer >60 >60 mL/min   GFR calc Af Amer >60 >60 mL/min   Anion gap 10 5 - 15    Comment: Performed at [facility], [place] [address], [place], [place] [address]  CBC     Status: Abnormal   Collection Time: 10/19/18  2:30 AM  Result Value Ref Range   WBC 8.4 4.0 - 10.5 K/uL   RBC 3.41 (L) 3.87 - 5.11 MIL/uL   Hemoglobin 10.1 (L) 12.0 - 15.0 g/dL   HCT 30.7 (L) 36.0 - 46.0 %   MCV 90.0 80.0 - 100.0 fL   MCH 29.6 26.0 - 34.0 pg   MCHC 32.9 30.0 - 36.0 g/dL   RDW 13.3 11.5 - 15.5 %   Platelets 256 150 - 400 K/uL   nRBC 0.0 0.0 - 0.2 %    Comment: Performed at [facility], [place] [address], [place], [place] [address]    Ct Pelvis W Contrast  Result Date: 10/18/2018 CLINICAL DATA:  Anal rectal abscess 1 week. EXAM: CT PELVIS WITH CONTRAST TECHNIQUE:  Multidetector CT imaging of the pelvis was performed using the standard protocol following the bolus administration of intravenous contrast. CONTRAST:  138mL OMNIPAQUE IOHEXOL 300 MG/ML  SOLN COMPARISON:  None. FINDINGS: Urinary Tract: Partially visualized 6.0 cm left renal cyst. Left renal hilum oriented anteriorly. Visualized portions of the ureters and bladder are unremarkable. Bowel: Minimal  colonic diverticulosis. Appendix not visualized. Visualized portions of the small bowel are normal. Vascular/Lymphatic: Mild calcified plaque over the distal abdominal aorta. No adenopathy. Reproductive:  Previous hysterectomy. Other: There is a small subcutaneous fluid collection over the lower left gluteal region immediately left of midline below the level of the ischial tuberosities. This measures 1 x 2.2 x 2.3 cm in transverse, AP and craniocaudal dimension. This may represent a subcutaneous infected versus noninfected fluid collection. No evidence of anal rectal abscess. Musculoskeletal: Moderate degenerate change of the lumbosacral spine with significant disc disease over the L3-4, L4-5 and L5-S1 levels. Mild degenerate change of the hips. IMPRESSION: No evidence of anorectal abscess. Small subcutaneous fluid collection adjacent the midline over the left lower gluteal crease measuring 1.0 x 2.2 x 2.3 cm. This may represent a small subcutaneous abscess versus noninfected fluid collection. Mild colonic diverticulosis. Aortic Atherosclerosis (ICD10-I70.0). Partially visualized 6 cm left renal cyst. Electronically Signed   By: Marin Olp M.D.   On: 10/18/2018 20:09    ROS - all of the below systems have been reviewed with the patient and positives are indicated with bold text General: chills, fever or night sweats Eyes: blurry vision or double vision ENT: epistaxis or sore throat Allergy/Immunology: itchy/watery eyes or nasal congestion Hematologic/Lymphatic: bleeding problems, blood clots or swollen lymph nodes Endocrine: temperature intolerance or unexpected weight changes Breast: new or changing breast lumps or nipple discharge Resp: cough, shortness of breath, or wheezing CV: chest pain or dyspnea on exertion GI: as per HPI GU: dysuria, trouble voiding, or hematuria MSK: joint pain or joint stiffness Neuro: TIA or stroke symptoms Derm: pruritus and skin lesion changes Psych: anxiety and  depression  PE Blood pressure 112/67, pulse 80, temperature 98.4 F (36.9 C), temperature source Oral, resp. rate 15, height 5' 6.5" (1.689 m), weight 63 kg, SpO2 97 %. Constitutional: NAD; conversant; no deformities Eyes: Moist conjunctiva; no lid lag; anicteric; PERRL Neck: Trachea midline; no thyromegaly Lungs: Normal respiratory effort; no tactile fremitus CV: RRR; no palpable thrills; no pitting edema GI: Abd soft, NT/ND; no palpable hepatosplenomegaly MSK: Normal extremities; no clubbing/cyanosis Skin: Bilateral ischiorectal wounds - L ~4x4 cm in size, R ~2x3cm in size with some surrounding erythema - exposed fat; no fluctuance; no purulent drainage - just some fibrinous exudate overlying that was removed at bedside with dry gauze. Psychiatric: Appropriate affect; alert and oriented x3 Lymphatic: No palpable cervical or axillary lymphadenopathy  Results for orders placed or performed during the hospital encounter of 10/18/18 (from the past 48 hour(s))  CBC with Differential     Status: Abnormal   Collection Time: 10/18/18  7:10 PM  Result Value Ref Range   WBC 9.0 4.0 - 10.5 K/uL   RBC 3.96 3.87 - 5.11 MIL/uL   Hemoglobin 11.8 (L) 12.0 - 15.0 g/dL   HCT 36.0 36.0 - 46.0 %   MCV 90.9 80.0 - 100.0 fL   MCH 29.8 26.0 - 34.0 pg   MCHC 32.8 30.0 - 36.0 g/dL   RDW 13.4 11.5 - 15.5 %   Platelets 299 150 - 400 K/uL   nRBC 0.0 0.0 - 0.2 %   Neutrophils  Relative % 63 %   Neutro Abs 5.7 1.7 - 7.7 K/uL   Lymphocytes Relative 19 %   Lymphs Abs 1.7 0.7 - 4.0 K/uL   Monocytes Relative 15 %   Monocytes Absolute 1.3 (H) 0.1 - 1.0 K/uL   Eosinophils Relative 2 %   Eosinophils Absolute 0.2 0.0 - 0.5 K/uL   Basophils Relative 0 %   Basophils Absolute 0.0 0.0 - 0.1 K/uL   Immature Granulocytes 1 %   Abs Immature Granulocytes 0.07 0.00 - 0.07 K/uL    Comment: Performed at [facility] [address] [place], [place] [address]  Basic metabolic panel     Status: Abnormal    Collection Time: 10/18/18  7:10 PM  Result Value Ref Range   Sodium 130 (L) 135 - 145 mmol/L   Potassium 4.1 3.5 - 5.1 mmol/L   Chloride 97 (L) 98 - 111 mmol/L   CO2 22 22 - 32 mmol/L   Glucose, Bld 116 (H) 70 - 99 mg/dL   BUN 15 8 - 23 mg/dL   Creatinine, Ser 0.83 0.44 - 1.00 mg/dL   Calcium 9.4 8.9 - 10.3 mg/dL   GFR calc non Af Amer >60 >60 mL/min   GFR calc Af Amer >60 >60 mL/min   Anion gap 11 5 - 15    Comment: Performed at [facility], [place] [address], [place], [place] [address]  I-Stat CG4 Lactic Acid, ED     Status: None   Collection Time: 10/18/18  7:25 PM  Result Value Ref Range   Lactic Acid, Venous 1.25 0.5 - 1.9 mmol/L  I-Stat Chem 8, ED     Status: Abnormal   Collection Time: 10/18/18  7:25 PM  Result Value Ref Range   Sodium 130 (L) 135 - 145 mmol/L   Potassium 4.1 3.5 - 5.1 mmol/L   Chloride 99 98 - 111 mmol/L   BUN 16 8 - 23 mg/dL   Creatinine, Ser 0.70 0.44 - 1.00 mg/dL   Glucose, Bld 115 (H) 70 - 99 mg/dL   Calcium, Ion 1.25 1.15 - 1.40 mmol/L   TCO2 24 22 - 32 mmol/L   Hemoglobin 12.2 12.0 - 15.0 g/dL   HCT 36.0 36.0 - 46.0 %  Wound or Superficial Culture     Status: None (Preliminary result)   Collection Time: 10/18/18  9:29 PM  Result Value Ref Range   Specimen Description BUTTOCKS    Special Requests NONE    Gram Stain      ABUNDANT WBC PRESENT, PREDOMINANTLY PMN ABUNDANT GRAM POSITIVE COCCI Performed at [facility], [place] [address] [place], [place] [address]    Culture PENDING    Report Status PENDING   Comprehensive metabolic panel     Status: Abnormal   Collection Time: 10/19/18  2:30 AM  Result Value Ref Range   Sodium 131 (L) 135 - 145 mmol/L   Potassium 4.0 3.5 - 5.1 mmol/L   Chloride 99 98 - 111 mmol/L   CO2 22 22 - 32 mmol/L   Glucose, Bld 102 (H) 70 - 99 mg/dL   BUN 12 8 - 23 mg/dL   Creatinine, Ser 0.84 0.44 - 1.00 mg/dL   Calcium 9.0 8.9 - 10.3 mg/dL   Total Protein 5.5 (L) 6.5 - 8.1 g/dL   Albumin 2.5 (L) 3.5 -  5.0 g/dL   AST 20 15 - 41 U/L   ALT 20 0 - 44 U/L   Alkaline Phosphatase  66 38 - 126 U/L   Total Bilirubin 0.5 0.3 - 1.2 mg/dL   GFR calc non Af Amer >60 >60 mL/min   GFR calc Af Amer >60 >60 mL/min   Anion gap 10 5 - 15    Comment: Performed at [facility] [address] [place], [place] [address]  CBC     Status: Abnormal   Collection Time: 10/19/18  2:30 AM  Result Value Ref Range   WBC 8.4 4.0 - 10.5 K/uL   RBC 3.41 (L) 3.87 - 5.11 MIL/uL   Hemoglobin 10.1 (L) 12.0 - 15.0 g/dL   HCT 30.7 (L) 36.0 - 46.0 %   MCV 90.0 80.0 - 100.0 fL   MCH 29.6 26.0 - 34.0 pg   MCHC 32.9 30.0 - 36.0 g/dL   RDW 13.3 11.5 - 15.5 %   Platelets 256 150 - 400 K/uL   nRBC 0.0 0.0 - 0.2 %    Comment: Performed at [facility], [place] [address] [place], [place] [address]    Ct Pelvis W Contrast  Result Date: 10/18/2018 CLINICAL DATA:  Anal rectal abscess 1 week. EXAM: CT PELVIS WITH CONTRAST TECHNIQUE: Multidetector CT imaging of the pelvis was performed using the standard protocol following the bolus administration of intravenous contrast. CONTRAST:  11mL OMNIPAQUE IOHEXOL 300 MG/ML  SOLN COMPARISON:  None. FINDINGS: Urinary Tract: Partially visualized 6.0 cm left renal cyst. Left renal hilum oriented anteriorly. Visualized portions of the ureters and bladder are unremarkable. Bowel: Minimal colonic diverticulosis. Appendix not visualized. Visualized portions of the small bowel are normal. Vascular/Lymphatic: Mild calcified plaque over the distal abdominal aorta. No adenopathy. Reproductive:  Previous hysterectomy. Other: There is a small subcutaneous fluid collection over the lower left gluteal region immediately left of midline below the level of the ischial tuberosities. This measures 1 x 2.2 x 2.3 cm in transverse, AP and craniocaudal dimension. This may represent a subcutaneous infected versus noninfected fluid collection. No evidence of anal rectal abscess. Musculoskeletal: Moderate  degenerate change of the lumbosacral spine with significant disc disease over the L3-4, L4-5 and L5-S1 levels. Mild degenerate change of the hips. IMPRESSION: No evidence of anorectal abscess. Small subcutaneous fluid collection adjacent the midline over the left lower gluteal crease measuring 1.0 x 2.2 x 2.3 cm. This may represent a small subcutaneous abscess versus noninfected fluid collection. Mild colonic diverticulosis. Aortic Atherosclerosis (ICD10-I70.0). Partially visualized 6 cm left renal cyst. Electronically Signed   By: Marin Olp M.D.   On: 10/18/2018 20:09   A/P: Raven Sampson is an 84 y.o. female with hx HTN, DJD here with open wounds on both buttocks  -I reviewed her CT and the location of the possible collection appears in the wound bed which is actively draining -Given progression of size of these wounds over the last month, this could represent pressure ulcer vs pyoderma gangrenosum (which would be unusual) -Agree with IV abx for the cellulitis surrounding; she is afebrile and with normal wbc; no devitalized/gangrenous tissue - at this point its all liquid fat -Continue BID wet to dry dressing; will get wound care consult -Would consider dermatology evaluation for possible pyoderma gangrenosum as well and treatment recommendations. If this is in fact pyoderma, would avoid surgical debridement as it will make it worse  Sharon Mt. Dema Severin, M.D. [facility]

## 2018-10-19 NOTE — Progress Notes (Signed)
Patient arrived to room with assist of one via bed from ED. Patient walked steadily from bed to room bed. Oriented her to [employer]. Daughter and grandson at bedside.

## 2018-10-19 NOTE — Consult Note (Signed)
[facility] Nurse wound consult note Reason for Consult: Asked to see patient for care in the full thickness wounds with presentation confounding for pyoderma gangrenosum with pinpoint openings presenting and draining purulent material, now full thickness after bedside debridement of necrotic tissue by Dr. Dema Severin. L>R. Exquisitely painful.  No history of IBD (UC or Crohn's Disease). Absence of purple hue. Appearance is not consistent with a Pressure Injury.   Wound type: Infectious Pressure Injury POA: NA Measurement: Left: 3.8cmx 4cm x 0.2cm  Right: 4cm x 2.5cm x 0.2cm Wound SKA:JGOTL red, moist Drainage (amount, consistency, odor) serous Periwound: erythematous, indurated. No purple hue surrounding wounds. Dressing procedure/placement/frequency: I have provided Nursing with guidance for daily application of calcium alginate dressing which will absorb exudate and after moistening, will remove easily. These dressings are believed to be atraumatic in removal after wetting.  I have also provided a pressure redistribution chair cushion for her use when OOB to chair.  May require definitive diagnosis by dermatology, recommended consideration of aggressive steroid therapy if believed to be PG.   [facility] nursing team will not follow, but will remain available to this patient, the nursing and medical teams.  Please re-consult if needed. Thanks, Maudie Flakes, MSN, RN, [place], Arther Abbott  Pager# [phone]

## 2018-10-19 NOTE — Progress Notes (Signed)
Pharmacy Antibiotic Note  Raven Sampson is a 84 y.o. female admitted on 10/18/2018 with rashes on buttocks, found to have cellulitis with early abscess and may need I&D.  Pharmacy has been consulted for vancomycin dosing.  Patient is also on Rocephin and Flagyl.  Noted she received vancomycin 1gm and Rocephin 1gm last night.  SCr 0.84, CrCL 48 ml/min.  Afebrile, WBC WNL, LA 1.25.  Plan: Vanc 1gm IV Q24H CTX 1gm IV Q24H per MD Flagyl 500mg  IV Q8H per MD Monitor renal fxn, micro data, vanc trough if indicated   Height: 5' 6.5" (168.9 cm) Weight: 139 lb (63 kg) IBW/kg (Calculated) : 60.45  Temp (24hrs), Avg:98 F (36.7 C), Min:97.7 F (36.5 C), Max:98.4 F (36.9 C)  Recent Labs  Lab 10/18/18 1910 10/18/18 1925 10/19/18 0230  WBC 9.0  --  8.4  CREATININE 0.83 0.70 0.84  LATICACIDVEN  --  1.25  --     Estimated Creatinine Clearance: 47.6 mL/min (by C-G formula based on SCr of 0.84 mg/dL).    Allergies  Allergen Reactions  . Codeine Nausea And Vomiting and Other (See Comments)    dizziness  . Tape Other (See Comments)    Burning sensation - pls use paper tape  . Ciprofloxacin Rash  . Sulfa Antibiotics Nausea And Vomiting and Rash    Vanc 11/30 >> CTX 11/30 >> Flagyl 12/1 >>  11/30 buttocks - GPC on Gram stain   Becky Colan D. Mina Marble, PharmD, BCPS, [place] 10/19/2018, 10:01 AM

## 2018-10-20 DIAGNOSIS — R5381 Other malaise: Secondary | ICD-10-CM

## 2018-10-20 LAB — BASIC METABOLIC PANEL
Anion gap: 9 (ref 5–15)
BUN: 9 mg/dL (ref 8–23)
CO2: 25 mmol/L (ref 22–32)
Calcium: 9 mg/dL (ref 8.9–10.3)
Chloride: 99 mmol/L (ref 98–111)
Creatinine, Ser: 0.83 mg/dL (ref 0.44–1.00)
GFR calc Af Amer: >60 mL/min (ref >60)
Glucose, Bld: 99 mg/dL (ref 70–99)
Potassium: 4 mmol/L (ref 3.5–5.1)
Sodium: 133 mmol/L — ABNORMAL LOW (ref 135–145)

## 2018-10-20 MED ORDER — AMLODIPINE BESYLATE 5 MG PO TABS
5.0000 mg | ORAL_TABLET | Freq: Every day | ORAL | Status: DC
  Administered 2018-10-20 – 2018-10-21 (×2): 5 mg via ORAL
  Filled 2018-10-20 (×2): qty 1

## 2018-10-20 NOTE — Evaluation (Signed)
Physical Therapy Evaluation Patient Details Name: TATYANA BIBER MRN: [id number] DOB: 08/16/1932 Today's Date: 10/20/2018   History of Present Illness  84yo female with progressive rash on buttocks, referred to the ED by Urgent Care due to possible cellulitis, and now with open oozing wounds. Admitted for anti-biotics. PMH HTN, RCR, hx leg surgery   Clinical Impression   Patient received in bed with daughter present during session, very motivated and eager to participate in PT but somewhat impulsive today and requiring verbal cues for safety and pacing of activities. Able to complete bed mobility with Mod(I) and functional transfers with S, but became quite unsteady with ambulation requiring HHA of +1 and MinA to maintain balance during gait of approximately 29f. Education provided regarding benefits of RW for safety and fall prevention as well as skilled OP PT to gain balance and strength after DC from the hospital, daughter and patient agreeable. She was left up in the chair with all needs met, chair alarm active, and daughter present this afternoon.     Follow Up Recommendations Outpatient PT;Supervision/Assistance - 24 hour    Equipment Recommendations  Rolling walker with 5" wheels    Recommendations for Other Services       Precautions / Restrictions Precautions Precautions: Fall;Other (comment) Precaution Comments: needs special pressure relief cushion to sit up in chair  Restrictions Weight Bearing Restrictions: No      Mobility  Bed Mobility Overal bed mobility: Modified Independent             General bed mobility comments: no cues or physical assistance provided   Transfers Overall transfer level: Needs assistance Equipment used: None Transfers: Sit to/from Stand Sit to Stand: Supervision         General transfer comment: S for safety, no cues or physical assist provided   Ambulation/Gait Ambulation/Gait assistance: Min assist Gait Distance (Feet): 80  Feet Assistive device: 1 person hand held assist Gait Pattern/deviations: Step-through pattern;Drifts right/left;Narrow base of support Gait velocity: decreased    General Gait Details: generally unsteady requiring 1 person HHA to maintain balance and cues for safety especially on turns. Would likely benefit from B UE support   Stairs            Wheelchair Mobility    Modified Rankin (Stroke Patients Only)       Balance Overall balance assessment: Needs assistance Sitting-balance support: Bilateral upper extremity supported;Feet supported Sitting balance-Leahy Scale: Good     Standing balance support: No upper extremity supported;During functional activity Standing balance-Leahy Scale: Poor Standing balance comment: unsteady, requires MinA to maintain balance                              Pertinent Vitals/Pain Pain Assessment: No/denies pain    Home Living Family/patient expects to be discharged to:: Private residence Living Arrangements: Alone Available Help at Discharge: Family;Available PRN/intermittently Type of Home: House Home Access: Ramped entrance     Home Layout: One level Home Equipment: Grab bars - toilet;Grab bars - tub/shower Additional Comments: has been living alone with no problems, planning to stay with daughter after discharge from hospital     Prior Function Level of Independence: Independent               Hand Dominance   Dominant Hand: Right    Extremity/Trunk Assessment   Upper Extremity Assessment Upper Extremity Assessment: Overall WFL for tasks assessed    Lower Extremity Assessment Lower  Extremity Assessment: Generalized weakness    Cervical / Trunk Assessment Cervical / Trunk Assessment: Normal  Communication   Communication: No difficulties  Cognition Arousal/Alertness: Awake/alert Behavior During Therapy: Impulsive Overall Cognitive Status: Within Functional Limits for tasks assessed                                  General Comments: impulsive and moves quickly, cues for safety and pace of activities       General Comments      Exercises     Assessment/Plan    PT Assessment Patient needs continued PT services  PT Problem List Decreased strength;Decreased activity tolerance;Decreased safety awareness;Decreased balance;Decreased coordination       PT Treatment Interventions Balance training;DME instruction;Gait training;Neuromuscular re-education;Stair training;Functional mobility training;Patient/family education;Therapeutic activities;Therapeutic exercise    PT Goals (Current goals can be found in the Care Plan section)  Acute Rehab PT Goals Patient Stated Goal: go home, get back to routine  PT Goal Formulation: With patient/family Time For Goal Achievement: 11/03/18 Potential to Achieve Goals: Good    Frequency Min 3X/week   Barriers to discharge        Co-evaluation               AM-PAC PT "6 Clicks" Mobility  Outcome Measure Help needed turning from your back to your side while in a flat bed without using bedrails?: None Help needed moving from lying on your back to sitting on the side of a flat bed without using bedrails?: None Help needed moving to and from a bed to a chair (including a wheelchair)?: A Little Help needed standing up from a chair using your arms (e.g., wheelchair or bedside chair)?: A Little Help needed to walk in hospital room?: A Little Help needed climbing 3-5 steps with a railing? : A Lot 6 Click Score: 19    End of Session   Activity Tolerance: Patient tolerated treatment well Patient left: in chair;with call bell/phone within reach;with chair alarm set;with family/visitor present   PT Visit Diagnosis: Unsteadiness on feet (R26.81);Muscle weakness (generalized) (M62.81)    Time: 8022-3361 PT Time Calculation (min) (ACUTE ONLY): 17 min   Charges:   PT Evaluation $PT Eval Low Complexity: 1 Low            Deniece Ree PT, DPT, CBIS  Supplemental Physical Therapist [place]    Pager [phone] Acute Rehab Office [phone]

## 2018-10-20 NOTE — Progress Notes (Addendum)
PROGRESS NOTE    Raven Sampson   HER:[id number]  DOB: 07-02-1932  DOA: 10/18/2018 PCP: Bayard Beaver, PA-C   Brief Narrative:  Raven Sampson   is a 84 y.o. female with medical history significant of hypertension who started seeing some rashes in her buttocks for about a month now.  They have been progressively getting worse lately.  Patient went to the urgent care center today where she was seen and evaluated and sent to the ER with suspected cellulitis.  She was seen in the ER and evaluated.     CT abdomen pelvis showed No evidence of anorectal abscess. Small subcutaneous fluid collection adjacent the midline over the left lower gluteal crease measuring 1.0 x 2.2 x 2.3 cm. This may represent a small subcutaneous abscess versus noninfected fluid collection. WBC 9.0, no fever Sodium 130  Subjective: Pain is much better today.    Assessment & Plan:     Cellulitis and draining abscess of buttocks  - seen by gen surg, pressure ulcer vs pyoderma gangrenosum (which would be unusual) > cont IV abx, no need for I&D, consider derm evaluation for pyoderma gangrenosum  - Unfortunately derm assistance not available at this hospital - recently on Prednisone for a back injury, completed last week - amount of drainage is diminishing and pain much better since lesions have drained according to patient  - culture growing Staph aureus, sens pending, have d/w ID will narrow abx to IV vanc and await sensitivities    Hyponatremia/ dehydration - cont slow IVF, creat stable today and Na ^133    Benign essential HTN - will switch from Lisinopril to norvasc 10 qd given renal risks pf abx. etc    Debility - PT eval pending   DVT prophylaxis: LOvenox Code Status: Full code Family Communication: none here today Disposition Plan: f/u PT eval and wound cultures  Kelly Splinter MD [facility] pgr [phone] 04/13/2018, 9:25 AM   Consultants: gen surgery Procedures:  none Antimicrobials:  Anti-infectives (From admission, onward)   Start     Dose/Rate Route Frequency Ordered Stop   10/19/18 2000  cefTRIAXone (ROCEPHIN) 1 g in sodium chloride 0.9 % 100 mL IVPB     1 g 200 mL/hr over 30 Minutes Intravenous Every 24 hours 10/19/18 1001     10/19/18 2000  vancomycin (VANCOCIN) IVPB 1000 mg/200 mL premix     1,000 mg 200 mL/hr over 60 Minutes Intravenous Every 24 hours 10/19/18 1001     10/19/18 1100  cefTRIAXone (ROCEPHIN) 1 g in sodium chloride 0.9 % 100 mL IVPB  Status:  Discontinued     1 g 200 mL/hr over 30 Minutes Intravenous Every 24 hours 10/19/18 0954 10/19/18 1001   10/19/18 0930  metroNIDAZOLE (FLAGYL) IVPB 500 mg     500 mg 100 mL/hr over 60 Minutes Intravenous Every 8 hours 10/19/18 0927     10/18/18 1900  vancomycin (VANCOCIN) 1,250 mg in sodium chloride 0.9 % 250 mL IVPB     1,250 mg 166.7 mL/hr over 90 Minutes Intravenous  Once 10/18/18 1849 10/18/18 2220   10/18/18 1900  cefTRIAXone (ROCEPHIN) 1 g in sodium chloride 0.9 % 100 mL IVPB     1 g 200 mL/hr over 30 Minutes Intravenous  Once 10/18/18 1849 10/18/18 2045       Objective: Vitals:   10/19/18 0501 10/19/18 1348 10/19/18 2118 10/20/18 0511  BP: 112/67 127/78 138/75 (!) 133/54  Pulse: 80 75 70 73  Resp: 15  16 16 16   Temp: 98.4 F (36.9 C) 97.8 F (36.6 C) 98.2 F (36.8 C) 98.3 F (36.8 C)  TempSrc: Oral Oral Oral Oral  SpO2: 97% 99% 99% 99%  Weight:      Height:        Intake/Output Summary (Last 24 hours) at 10/20/2018 0911 Last data filed at 10/20/2018 0500 Gross per 24 hour  Intake 1887.65 ml  Output -  Net 1887.65 ml   Filed Weights   10/18/18 1819  Weight: 63 kg    Examination: General exam: Appears comfortable  HEENT: PERRLA, oral mucosa moist, no sclera icterus or thrush Respiratory system: Clear to auscultation. Respiratory effort normal. Cardiovascular system: S1 & S2 heard, RRR.   Gastrointestinal system: Abdomen soft, non-tender, nondistended.  Normal bowel sounds. Back: bilat oval shaped wounds w/ erythematous edges and undermining, sig depth, minimal drainage no odor, L deeper than R wound, these wounds are inside the gluteal fold bilat symmetric almost Central nervous system: Alert and oriented. No focal neurological deficits. Extremities: No cyanosis, clubbing or edema Skin: No rashes or ulcers Psychiatry:  Mood & affect appropriate.       Basic Metabolic Panel: Recent Labs  Lab 10/18/18 1910 10/18/18 1925 10/19/18 0230 10/20/18 0216  NA 130* 130* 131* 133*  K 4.1 4.1 4.0 4.0  CL 97* 99 99 99  CO2 22  --  22 25  GLUCOSE 116* 115* 102* 99  BUN 15 16 12 9   CREATININE 0.83 0.70 0.84 0.83  CALCIUM 9.4  --  9.0 9.0   GFR: Estimated Creatinine Clearance: 48.2 mL/min (by C-G formula based on SCr of 0.83 mg/dL). Liver Function Tests: Recent Labs  Lab 10/19/18 0230  AST 20  ALT 20  ALKPHOS 66  BILITOT 0.5  PROT 5.5*  ALBUMIN 2.5*   No results for input(s): LIPASE, AMYLASE in the last 168 hours. No results for input(s): AMMONIA in the last 168 hours. Coagulation Profile: No results for input(s): INR, PROTIME in the last 168 hours. Cardiac Enzymes: No results for input(s): CKTOTAL, CKMB, CKMBINDEX, TROPONINI in the last 168 hours. BNP (last 3 results) No results for input(s): PROBNP in the last 8760 hours. HbA1C: No results for input(s): HGBA1C in the last 72 hours. CBG: No results for input(s): GLUCAP in the last 168 hours. Lipid Profile: No results for input(s): CHOL, HDL, LDLCALC, TRIG, CHOLHDL, LDLDIRECT in the last 72 hours. Thyroid Function Tests: No results for input(s): TSH, T4TOTAL, FREET4, T3FREE, THYROIDAB in the last 72 hours. Anemia Panel: No results for input(s): VITAMINB12, FOLATE, FERRITIN, TIBC, IRON, RETICCTPCT in the last 72 hours. Urine analysis: No results found for: COLORURINE, APPEARANCEUR, LABSPEC, PHURINE, GLUCOSEU, HGBUR, BILIRUBINUR, KETONESUR, PROTEINUR, UROBILINOGEN, NITRITE,  LEUKOCYTESUR Sepsis Labs: @LABRCNTIP (procalcitonin:4,lacticidven:4) ) Recent Results (from the past 240 hour(s))  Wound or Superficial Culture     Status: None (Preliminary result)   Collection Time: 10/18/18  9:29 PM  Result Value Ref Range Status   Specimen Description BUTTOCKS  Final   Special Requests NONE  Final   Gram Stain   Final    ABUNDANT WBC PRESENT, PREDOMINANTLY PMN ABUNDANT GRAM POSITIVE COCCI    Culture   Final    MODERATE STAPHYLOCOCCUS AUREUS CULTURE REINCUBATED FOR BETTER GROWTH Performed at [facility], [address], [place], [place] [address]    Report Status PENDING  Incomplete         Radiology Studies: Ct Pelvis W Contrast  Result Date: 10/18/2018 CLINICAL DATA:  Anal rectal  abscess 1 week. EXAM: CT PELVIS WITH CONTRAST TECHNIQUE: Multidetector CT imaging of the pelvis was performed using the standard protocol following the bolus administration of intravenous contrast. CONTRAST:  137mL OMNIPAQUE IOHEXOL 300 MG/ML  SOLN COMPARISON:  None. FINDINGS: Urinary Tract: Partially visualized 6.0 cm left renal cyst. Left renal hilum oriented anteriorly. Visualized portions of the ureters and bladder are unremarkable. Bowel: Minimal colonic diverticulosis. Appendix not visualized. Visualized portions of the small bowel are normal. Vascular/Lymphatic: Mild calcified plaque over the distal abdominal aorta. No adenopathy. Reproductive:  Previous hysterectomy. Other: There is a small subcutaneous fluid collection over the lower left gluteal region immediately left of midline below the level of the ischial tuberosities. This measures 1 x 2.2 x 2.3 cm in transverse, AP and craniocaudal dimension. This may represent a subcutaneous infected versus noninfected fluid collection. No evidence of anal rectal abscess. Musculoskeletal: Moderate degenerate change of the lumbosacral spine with significant disc disease over the L3-4, L4-5 and L5-S1 levels. Mild degenerate change of  the hips. IMPRESSION: No evidence of anorectal abscess. Small subcutaneous fluid collection adjacent the midline over the left lower gluteal crease measuring 1.0 x 2.2 x 2.3 cm. This may represent a small subcutaneous abscess versus noninfected fluid collection. Mild colonic diverticulosis. Aortic Atherosclerosis (ICD10-I70.0). Partially visualized 6 cm left renal cyst. Electronically Signed   By: Marin Olp M.D.   On: 10/18/2018 20:09      Scheduled Meds: . calcium-vitamin D   Oral BID  . enoxaparin (LOVENOX) injection  40 mg Subcutaneous Q24H  . lisinopril  10 mg Oral Daily  . loratadine  10 mg Oral Daily  . vitamin E  400 Units Oral Daily   Continuous Infusions: . sodium chloride Stopped (10/19/18 1758)  . cefTRIAXone (ROCEPHIN)  IV Stopped (10/19/18 2026)  . metronidazole Stopped (10/20/18 0222)  . vancomycin Stopped (10/19/18 2156)     LOS: 2 days

## 2018-10-20 NOTE — Progress Notes (Signed)
PT Cancellation Note  Patient Details Name: Raven Sampson MRN: [id number] DOB: November 04, 1932   Cancelled Treatment:    Reason Eval/Treat Not Completed: Other (comment) attempted to evaluate patient, she was in the midst of receiving wound care and not available. Will attempt to try back later if time/schedule allow.    Deniece Ree PT, DPT, CBIS  Supplemental Physical Therapist [facility]    Pager [phone] Acute Rehab Office [phone]

## 2018-10-20 NOTE — Final Consult Note (Signed)
[facility] Progress Note     Subjective: CC: buttock wounds  Patient reports pain is significantly better. Tolerated dressing change well this AM. Patient reports that her daughter has a history of skin abscesses but that she has never had anything like that before.   Objective: Vital signs in last 24 hours: Temp:  [97.8 F (36.6 C)-98.3 F (36.8 C)] 98.3 F (36.8 C) (12/02 0511) Pulse Rate:  [70-75] 73 (12/02 0511) Resp:  [16] 16 (12/02 0511) BP: (127-138)/(54-78) 133/54 (12/02 0511) SpO2:  [99 %] 99 % (12/02 0511) Last BM Date: 10/18/18  Intake/Output from previous day: 12/01 0701 - 12/02 0700 In: 2127.7 [P.O.:480; I.V.:1047.7; IV Piggyback:600] Out: -  Intake/Output this shift: No intake/output data recorded.  PE: Gen:  Alert, NAD, pleasant GU:  Bilateral buttock ulcers with minimal drainage, no TTP, mild improvement in surrounding erythema compared to photo yesterday and less necrotic slough  Psych: A&Ox3   Lab Results:  Recent Labs    10/18/18 1910 10/18/18 1925 10/19/18 0230  WBC 9.0  --  8.4  HGB 11.8* 12.2 10.1*  HCT 36.0 36.0 30.7*  PLT 299  --  256   BMET Recent Labs    10/19/18 0230 10/20/18 0216  NA 131* 133*  K 4.0 4.0  CL 99 99  CO2 22 25  GLUCOSE 102* 99  BUN 12 9  CREATININE 0.84 0.83  CALCIUM 9.0 9.0   PT/INR No results for input(s): LABPROT, INR in the last 72 hours. CMP     Component Value Date/Time   NA 133 (L) 10/20/2018 0216   K 4.0 10/20/2018 0216   CL 99 10/20/2018 0216   CO2 25 10/20/2018 0216   GLUCOSE 99 10/20/2018 0216   BUN 9 10/20/2018 0216   CREATININE 0.83 10/20/2018 0216   CALCIUM 9.0 10/20/2018 0216   PROT 5.5 (L) 10/19/2018 0230   ALBUMIN 2.5 (L) 10/19/2018 0230   AST 20 10/19/2018 0230   ALT 20 10/19/2018 0230   ALKPHOS 66 10/19/2018 0230   BILITOT 0.5 10/19/2018 0230   GFRNONAA >60 10/20/2018 0216   GFRAA >60 10/20/2018 0216   Lipase  No results found for:  LIPASE     Studies/Results: Ct Pelvis W Contrast  Result Date: 10/18/2018 CLINICAL DATA:  Anal rectal abscess 1 week. EXAM: CT PELVIS WITH CONTRAST TECHNIQUE: Multidetector CT imaging of the pelvis was performed using the standard protocol following the bolus administration of intravenous contrast. CONTRAST:  165mL OMNIPAQUE IOHEXOL 300 MG/ML  SOLN COMPARISON:  None. FINDINGS: Urinary Tract: Partially visualized 6.0 cm left renal cyst. Left renal hilum oriented anteriorly. Visualized portions of the ureters and bladder are unremarkable. Bowel: Minimal colonic diverticulosis. Appendix not visualized. Visualized portions of the small bowel are normal. Vascular/Lymphatic: Mild calcified plaque over the distal abdominal aorta. No adenopathy. Reproductive:  Previous hysterectomy. Other: There is a small subcutaneous fluid collection over the lower left gluteal region immediately left of midline below the level of the ischial tuberosities. This measures 1 x 2.2 x 2.3 cm in transverse, AP and craniocaudal dimension. This may represent a subcutaneous infected versus noninfected fluid collection. No evidence of anal rectal abscess. Musculoskeletal: Moderate degenerate change of the lumbosacral spine with significant disc disease over the L3-4, L4-5 and L5-S1 levels. Mild degenerate change of the hips. IMPRESSION: No evidence of anorectal abscess. Small subcutaneous fluid collection adjacent the midline over the left lower gluteal crease measuring 1.0 x 2.2 x 2.3 cm. This may represent a small subcutaneous abscess  versus noninfected fluid collection. Mild colonic diverticulosis. Aortic Atherosclerosis (ICD10-I70.0). Partially visualized 6 cm left renal cyst. Electronically Signed   By: Marin Olp M.D.   On: 10/18/2018 20:09    Anti-infectives: Anti-infectives (From admission, onward)   Start     Dose/Rate Route Frequency Ordered Stop   10/19/18 2000  cefTRIAXone (ROCEPHIN) 1 g in sodium chloride 0.9 % 100 mL  IVPB     1 g 200 mL/hr over 30 Minutes Intravenous Every 24 hours 10/19/18 1001     10/19/18 2000  vancomycin (VANCOCIN) IVPB 1000 mg/200 mL premix     1,000 mg 200 mL/hr over 60 Minutes Intravenous Every 24 hours 10/19/18 1001     10/19/18 1100  cefTRIAXone (ROCEPHIN) 1 g in sodium chloride 0.9 % 100 mL IVPB  Status:  Discontinued     1 g 200 mL/hr over 30 Minutes Intravenous Every 24 hours 10/19/18 0954 10/19/18 1001   10/19/18 0930  metroNIDAZOLE (FLAGYL) IVPB 500 mg     500 mg 100 mL/hr over 60 Minutes Intravenous Every 8 hours 10/19/18 0927     10/18/18 1900  vancomycin (VANCOCIN) 1,250 mg in sodium chloride 0.9 % 250 mL IVPB     1,250 mg 166.7 mL/hr over 90 Minutes Intravenous  Once 10/18/18 1849 10/18/18 2220   10/18/18 1900  cefTRIAXone (ROCEPHIN) 1 g in sodium chloride 0.9 % 100 mL IVPB     1 g 200 mL/hr over 30 Minutes Intravenous  Once 10/18/18 1849 10/18/18 2045       Assessment/Plan HTN DJD  Buttock wounds - simple skin abscesses vs pyoderma gangrenosum - needs dermatology evaluation, but this is not available here in inpatient setting; could refer as OP  - Agree with IV abx for the cellulitis surrounding, ID to consult for abx narrowing - wounds appear mostly clean with only small amount of slough, no role for surgical debridement at this time -Continue BID wet to dry dressing and prn with BMs  No role for surgical debridement at this time, we will sign off. Please call if we can be of further assistance.    LOS: 2 days    Brigid Re , [facility] 10/20/2018, 8:42 AM Pager: [phone] Consults: [phone] Mon-Fri 7:00 am-4:30 pm Sat-Sun 7:00 am-11:30 am

## 2018-10-21 DIAGNOSIS — L0231 Cutaneous abscess of buttock: Secondary | ICD-10-CM | POA: Diagnosis present

## 2018-10-21 LAB — CBC
HCT: 35.2 % — ABNORMAL LOW (ref 36.0–46.0)
Hemoglobin: 11.8 g/dL — ABNORMAL LOW (ref 12.0–15.0)
MCH: 29.9 pg (ref 26.0–34.0)
MCHC: 33.5 g/dL (ref 30.0–36.0)
MCV: 89.3 fL (ref 80.0–100.0)
Platelets: 294 10*3/uL (ref 150–400)
RBC: 3.94 MIL/uL (ref 3.87–5.11)
RDW: 13.1 % (ref 11.5–15.5)
WBC: 6.2 10*3/uL (ref 4.0–10.5)
nRBC: 0 % (ref 0.0–0.2)

## 2018-10-21 LAB — BASIC METABOLIC PANEL
Anion gap: 7 (ref 5–15)
BUN: 13 mg/dL (ref 8–23)
CO2: 25 mmol/L (ref 22–32)
CREATININE: 0.78 mg/dL (ref 0.44–1.00)
Calcium: 9 mg/dL (ref 8.9–10.3)
Chloride: 99 mmol/L (ref 98–111)
GFR calc Af Amer: >60 mL/min (ref >60)
GFR calc non Af Amer: >60 mL/min (ref >60)
Glucose, Bld: 108 mg/dL — ABNORMAL HIGH (ref 70–99)
Potassium: 3.8 mmol/L (ref 3.5–5.1)
Sodium: 131 mmol/L — ABNORMAL LOW (ref 135–145)

## 2018-10-21 LAB — AEROBIC CULTURE W GRAM STAIN (SUPERFICIAL SPECIMEN)

## 2018-10-21 MED ORDER — LISINOPRIL 10 MG PO TABS: 10.0000 mg | ORAL_TABLET | Freq: Every day | ORAL | 0 refills | Status: AC

## 2018-10-21 MED ORDER — AMLODIPINE BESYLATE 5 MG PO TABS: 5.0000 mg | ORAL_TABLET | Freq: Every day | ORAL | 0 refills | Status: AC

## 2018-10-21 MED ORDER — SULFAMETHOXAZOLE-TRIMETHOPRIM 800-160 MG PO TABS: 1.0000 | ORAL_TABLET | Freq: Two times a day (BID) | ORAL | 0 refills | Status: AC

## 2018-10-21 NOTE — Discharge Summary (Signed)
Physician Discharge Summary  Patient ID: Raven Sampson MRN: [id number] DOB/AGE: Jul 07, 1932 84 y.o.  Admit date: 10/18/2018 Discharge date: 10/21/2018  Admission Diagnoses:   Abscess of multiple sites of buttock   Benign essential HTN  Discharge Diagnoses:    Abscess of multiple sites of buttock   Benign essential HTN   Dehydration , resolved   Hyponatremia, resolved   Discharged Condition: good  Presentation Summary: Raven Sampson  is a 84 y.o.femalewith medical history significant ofhypertension who started seeing some rashes in her buttocks for about a month now. They have been progressively getting worse lately. Patient went to the urgent care center today where she was seen and evaluated and sent to the ER with suspected cellulitis. She was seen in the ER and evaluated.   CT abdomen pelvis showed No evidence of anorectal abscess. Small subcutaneous fluid collection adjacent the midline over the left lower gluteal crease measuring 1.0 x 2.2 x 2.3 cm. This may represent a small subcutaneous abscess versus noninfected fluid collection. WBC 9.0, no fever Sodium 130  Hospital Course:   Bilat abscess of buttocks  - seen by gen surg, pressure ulcer vs pyoderma gangrenosum (which would be unusual) > cont IV abx, no need for I&D as lesions have drained spontaneously - gen surg recommends derm eval in OP setting, have d/w pt's daughter - recently  - amount of drainage is diminishing and pain much better since lesions have drained according to patient  - culture grew MRSA  > got IV Vanc x 3 days here will dc on Septra DS 1 bid x 7days to complete a 10 day course - local dressing changes to be done by the daughter    Hyponatremia/ dehydration - resolved - cont slow IVF, creat stable today and Na ^133    Benign essential HTN - will switch from Lisinopril to norvasc 5mg  /day for the next 2 weeks while on Bactrim then switch back to ACEi in 2 weeks    Debility - d/c  home   Discharge Exam: Blood pressure (!) 148/80, pulse 76, temperature 98.1 F (36.7 C), temperature source Oral, resp. rate 18, height 5' 6.5" (1.689 m), weight 63 kg, SpO2 98 %. General exam: Appears comfortable  HEENT: PERRLA, oral mucosa moist, no sclera icterus or thrush Respiratory system: Clear to auscultation. Respiratory effort normal. Cardiovascular system: S1 & S2 heard, RRR.   Gastrointestinal system: Abdomen soft, non-tender, nondistended. Normal bowel sounds. Back: bilat oval shaped wounds w/ erythematous edges and undermining, sig depth, minimal drainage no odor, L deeper than R wound, these wounds are inside the gluteal fold bilat symmetric almost Central nervous system: Alert and oriented. No focal neurological deficits. Extremities: No cyanosis, clubbing or edema Skin: No rashes or ulcers Psychiatry:  Mood & affect appropriate.   Disposition: Discharge disposition: 01-Home or Self Care        Allergies as of 10/21/2018      Reactions   Codeine Nausea And Vomiting, Other (See Comments)   dizziness   Tape Other (See Comments)   Burning sensation - pls use paper tape   Ciprofloxacin Rash   Sulfa Antibiotics Nausea And Vomiting, Rash      Medication List    STOP taking these medications   PREDNISONE PO     TAKE these medications   acetaminophen 500 MG tablet Commonly known as:  TYLENOL Take 1,000 mg by mouth every 6 (six) hours as needed for headache (pain).   amLODipine 5 MG tablet Commonly  known as:  NORVASC Take 1 tablet (5 mg total) by mouth daily for 14 days. Start taking on:  10/22/2018   CALCIUM 600-D PO Take 600 mg by mouth 2 (two) times daily.   COQ10 PO Take 1 capsule by mouth daily.   diclofenac 75 MG EC tablet Commonly known as:  VOLTAREN Take 75 mg by mouth daily as needed (pain).   fexofenadine 180 MG tablet Commonly known as:  ALLEGRA Take 180 mg by mouth daily.   fluocinolone 0.01 % external solution Commonly known as:   SYNALAR Apply 1 application topically See admin instructions. Apply topically to scalp once daily for two weeks, hold for two weeks and then repeat   lisinopril 10 MG tablet Commonly known as:  PRINIVIL,ZESTRIL Take 1 tablet (10 mg total) by mouth daily. Start taking on:  11/05/2018 What changed:  These instructions start on 11/05/2018. If you are unsure what to do until then, ask your doctor or other care provider.   OSTEO BI-FLEX REGULAR STRENGTH PO Take 1 tablet by mouth 2 (two) times daily.   REFRESH OP Place 1 drop into both eyes daily.   sulfamethoxazole-trimethoprim 800-160 MG tablet Commonly known as:  BACTRIM DS,SEPTRA DS Take 1 tablet by mouth 2 (two) times daily for 7 days.   vitamin E 400 UNIT capsule Take 400 Units by mouth daily.        Signed: Sandy Salaam Jailin Manocchio 10/21/2018, 5:02 PM

## 2018-10-21 NOTE — Progress Notes (Signed)
Discharge instructions reviewed with Pt, Daughter to take Pt home . Pt to go to front via w/c

## 2019-12-16 ENCOUNTER — Ambulatory Visit: Payer: Medicare Other

## 2020-01-02 ENCOUNTER — Ambulatory Visit: Payer: Medicare Other

## 2020-05-02 ENCOUNTER — Encounter (INDEPENDENT_AMBULATORY_CARE_PROVIDER_SITE_OTHER): Payer: Self-pay | Admitting: Ophthalmology

## 2020-05-02 ENCOUNTER — Ambulatory Visit (INDEPENDENT_AMBULATORY_CARE_PROVIDER_SITE_OTHER): Payer: Medicare HMO | Admitting: Ophthalmology

## 2020-05-02 ENCOUNTER — Other Ambulatory Visit: Payer: Self-pay

## 2020-05-02 DIAGNOSIS — H35722 Serous detachment of retinal pigment epithelium, left eye: Secondary | ICD-10-CM | POA: Diagnosis not present

## 2020-05-02 DIAGNOSIS — H353132 Nonexudative age-related macular degeneration, bilateral, intermediate dry stage: Secondary | ICD-10-CM | POA: Diagnosis not present

## 2020-05-02 DIAGNOSIS — H353222 Exudative age-related macular degeneration, left eye, with inactive choroidal neovascularization: Secondary | ICD-10-CM | POA: Insufficient documentation

## 2020-05-02 DIAGNOSIS — H353221 Exudative age-related macular degeneration, left eye, with active choroidal neovascularization: Secondary | ICD-10-CM | POA: Diagnosis not present

## 2020-05-02 DIAGNOSIS — H353124 Nonexudative age-related macular degeneration, left eye, advanced atrophic with subfoveal involvement: Secondary | ICD-10-CM | POA: Insufficient documentation

## 2020-05-02 MED ORDER — BEVACIZUMAB CHEMO INJECTION 1.25MG/0.05ML SYRINGE FOR KALEIDOSCOPE
1.2500 mg | INTRAVITREAL | Status: AC | PRN
  Administered 2020-05-02: 1.25 mg via INTRAVITREAL

## 2020-05-02 NOTE — Assessment & Plan Note (Signed)

## 2020-05-02 NOTE — Progress Notes (Signed)
05/02/2020     CHIEF COMPLAINT Patient presents for Retina Evaluation   HISTORY OF PRESENT ILLNESS: Raven Sampson is a 86 y.o. female who presents to the clinic today for:   HPI    Retina Evaluation    In left eye.  Duration of 3 months.  Associated Symptoms Negative for Flashes and Floaters.  Context:  distance vision.  Treatments tried include no treatments.          Comments    Patient states for the past several months she has had a decrease in vision in her left eye. Patient denies any other problems.  Recent visit with Dr. Barbie Haggis on 04/15/2019 disclose evidence of wet age-related macular degeneration OS        Last edited by Hurman Horn, MD on 05/02/2020  3:39 PM. (History)      Referring physician: Earnie Larsson, PA-C [facility] [address] [place],  [place] [address]  HISTORICAL INFORMATION:   Selected notes from the MEDICAL RECORD NUMBER       CURRENT MEDICATIONS: Current Outpatient Medications (Ophthalmic Drugs)  Medication Sig  . Polyvinyl Alcohol-Povidone (REFRESH OP) Place 1 drop into both eyes daily.   No current facility-administered medications for this visit. (Ophthalmic Drugs)   Current Outpatient Medications (Other)  Medication Sig  . acetaminophen (TYLENOL) 500 MG tablet Take 1,000 mg by mouth every 6 (six) hours as needed for headache (pain).  Marland Kitchen amLODipine (NORVASC) 5 MG tablet Take 1 tablet (5 mg total) by mouth daily for 14 days.  . Calcium Carb-Cholecalciferol (CALCIUM 600-D PO) Take 600 mg by mouth 2 (two) times daily.  . Coenzyme Q10 (COQ10 PO) Take 1 capsule by mouth daily.  . diclofenac (VOLTAREN) 75 MG EC tablet Take 75 mg by mouth daily as needed (pain).  . fexofenadine (ALLEGRA) 180 MG tablet Take 180 mg by mouth daily.  . fluocinolone (SYNALAR) 0.01 % external solution Apply 1 application topically See admin instructions. Apply topically to scalp once daily for two weeks, hold for two weeks and then repeat  .  Glucosamine-Chondroitin (OSTEO BI-FLEX REGULAR STRENGTH PO) Take 1 tablet by mouth 2 (two) times daily.  Marland Kitchen lisinopril (PRINIVIL,ZESTRIL) 10 MG tablet Take 1 tablet (10 mg total) by mouth daily.  . vitamin E 400 UNIT capsule Take 400 Units by mouth daily.   No current facility-administered medications for this visit. (Other)      REVIEW OF SYSTEMS:    ALLERGIES Allergies  Allergen Reactions  . Codeine Nausea And Vomiting and Other (See Comments)    dizziness  . Tape Other (See Comments)    Burning sensation - pls use paper tape  . Ciprofloxacin Rash  . Sulfa Antibiotics Nausea And Vomiting and Rash    PAST MEDICAL HISTORY Past Medical History:  Diagnosis Date  . Hypertension   . Leaky heart valve    Past Surgical History:  Procedure Laterality Date  . ABDOMINAL HYSTERECTOMY    . BLADDER SURGERY    . CATARACT EXTRACTION    . LEG SURGERY Left   . ROTATOR CUFF REPAIR Right     FAMILY HISTORY History reviewed. No pertinent family history.  SOCIAL HISTORY Social History   Tobacco Use  . Smoking status: Never Smoker  . Smokeless tobacco: Never Used  Substance Use Topics  . Alcohol use: Not Currently  . Drug use: Not Currently         OPHTHALMIC EXAM:  Base Eye Exam  Visual Acuity (Snellen - Linear)      Right Left   Dist cc 20/40 20/400   Dist ph cc NI NI   Correction: Glasses       Tonometry (Tonopen, 2:49 PM)      Right Left   Pressure 14 15       Visual Fields (Counting fingers)      Left Right    Full Full       Dilation    Both eyes: 1.0% Mydriacyl, 2.5% Phenylephrine @ 2:49 PM        Slit Lamp and Fundus Exam    External Exam      Right Left   External Normal Normal       Slit Lamp Exam      Right Left   Lids/Lashes Normal Normal   Conjunctiva/Sclera White and quiet White and quiet   Cornea Clear Clear   Anterior Chamber Deep and quiet Deep and quiet   Iris Round and reactive Round and reactive   Lens Posterior chamber  intraocular lens Posterior chamber intraocular lens   Anterior Vitreous Normal Normal       Fundus Exam      Right Left   Posterior Vitreous Posterior vitreous detachment Posterior vitreous detachment   Disc Normal    C/D Ratio 0.35 0.4   Macula Hard drusen, Pigmented atrophy, Intermediate age related macular degeneration, no hemorrhage, no exudates, no membrane, Retinal pigment epithelial mottling Macular thickening, Exudates, Drusen, Pigmented atrophy, Advanced age related macular degeneration, Retinal pigment epithelial mottling,, with subretinal fluid inferior to the fovea   Vessels Normal    Periphery Normal           IMAGING AND PROCEDURES  Imaging and Procedures for 05/02/20  OCT, Retina - OU - Both Eyes       Right Eye Quality was good. Scan locations included subfoveal. Central Foveal Thickness: 280. Progression has no prior data. Findings include retinal drusen , no SRF, no IRF.   Left Eye Quality was good. Scan locations included subfoveal. Central Foveal Thickness: 438. Progression has no prior data. Findings include subretinal fluid, choroidal neovascular membrane, outer retinal atrophy.   Notes OD, with no signs of CNVM.  OS with subfoveal hyper reflective material and subretinal fluid which extends inferior portion of the macula.  Features of retinal atrophy in the inferior macula suggest chronicity of the fluid       Intravitreal Injection, Pharmacologic Agent - OS - Left Eye       Time Out 05/02/2020. 3:48 PM. Confirmed correct patient, procedure, site, and patient consented.   Anesthesia Topical anesthesia was used. Anesthetic medications included Akten 3.5%.   Procedure Preparation included Tobramycin 0.3%, 10% betadine to eyelids. A 30 gauge needle was used.   Injection:  1.25 mg Bevacizumab (AVASTIN) SOLN   NDC: [address]-4, Lot: [id number]   Site: Left Eye, Waste: 0 mg  Post-op Post injection exam found visual acuity of at least counting fingers.  The patient tolerated the procedure well. There were no complications. The patient received written and verbal post procedure care education. Post injection medications were not given.                 ASSESSMENT/PLAN:  Exudative age-related macular degeneration of left eye with active choroidal neovascularization (HCC) The nature of wet macular degeneration was discussed with the patient.  Forms of therapy reviewed include the use of Anti-VEGF medications injected painlessly into the eye, as well as other possible  treatment modalities, including thermal laser therapy. Fellow eye involvement and risks were discussed with the patient. Upon the finding of wet age related macular degeneration, treatment will be offered. The treatment regimen is on a treat as needed basis with the intent to treat if necessary and extend interval of exams when possible. On average 1 out of 6 patients do not need lifetime therapy. However, the risk of recurrent disease is high for a lifetime.  Initially monthly, then periodic, examinations and evaluations will determine whether the next treatment is required on the day of the examination.,   OS, with active disease, will need to commence with intravitreal therapy promptly  Intermediate stage nonexudative age-related macular degeneration of both eyes The nature of age--related macular degeneration was discussed with the patient as well as the distinction between dry and wet types. Checking an Amsler Grid daily with advice to return immediately should a distortion develop, was given to the patient. The patient 's smoking status now and in the past was determined and advice based on the AREDS study was provided regarding the consumption of antioxidant supplements. AREDS 2 vitamin formulation was recommended. Consumption of dark leafy vegetables and fresh fruits of various colors was recommended. Treatment modalities for wet macular degeneration particularly the use of  intravitreal injections of anti-blood vessel growth factors was discussed with the patient. Avastin, Lucentis, and Eylea are the available options. On occasion, therapy includes the use of photodynamic therapy and thermal laser. Stressed to the patient do not rub eyes. All patient questions were answered.      ICD-10-CM   1. Exudative age-related macular degeneration of left eye with active choroidal neovascularization (HCC)  H35.3221 OCT, Retina - OU - Both Eyes    Intravitreal Injection, Pharmacologic Agent - OS - Left Eye    Bevacizumab (AVASTIN) SOLN 1.25 mg  2. Serous detachment of retinal pigment epithelium of left eye  H35.722 OCT, Retina - OU - Both Eyes  3. Intermediate stage nonexudative age-related macular degeneration of both eyes  H35.3132     1.  Will commence with intravitreal Avastin is pending precertification 2.  3.  Ophthalmic Meds Ordered this visit:  Meds ordered this encounter  Medications  . Bevacizumab (AVASTIN) SOLN 1.25 mg       Return in about 5 weeks (around 06/06/2020) for AVASTIN OCT, OS, dilate, DILATE OU, OD, OPTOS FFA L/R, COLOR FP.  There are no Patient Instructions on file for this visit.   Explained the diagnoses, plan, and follow up with the patient and they expressed understanding.  Patient expressed understanding of the importance of proper follow up care.   Clent Demark Jaevin Medearis M.D. Diseases & Surgery of the Retina and Vitreous [facility] [place] 05/02/20     Abbreviations: M myopia (nearsighted); A astigmatism; H hyperopia (farsighted); P presbyopia; Mrx spectacle prescription;  CTL contact lenses; OD right eye; OS left eye; OU both eyes  XT exotropia; ET esotropia; PEK punctate epithelial keratitis; PEE punctate epithelial erosions; DES dry eye syndrome; MGD meibomian gland dysfunction; ATs artificial tears; PFAT's preservative free artificial tears; [place] nuclear sclerotic cataract; PSC posterior subcapsular cataract; ERM epi-retinal  membrane; PVD posterior vitreous detachment; RD retinal detachment; DM diabetes mellitus; DR diabetic retinopathy; NPDR non-proliferative diabetic retinopathy; PDR proliferative diabetic retinopathy; CSME clinically significant macular edema; DME diabetic macular edema; dbh dot blot hemorrhages; CWS cotton wool spot; POAG primary open angle glaucoma; C/D cup-to-disc ratio; HVF humphrey visual field; GVF goldmann visual field; OCT optical coherence tomography; IOP  intraocular pressure; BRVO Branch retinal vein occlusion; CRVO central retinal vein occlusion; CRAO central retinal artery occlusion; BRAO branch retinal artery occlusion; RT retinal tear; SB scleral buckle; PPV pars plana vitrectomy; VH Vitreous hemorrhage; PRP panretinal laser photocoagulation; IVK intravitreal kenalog; VMT vitreomacular traction; MH Macular hole;  NVD neovascularization of the disc; NVE neovascularization elsewhere; AREDS age related eye disease study; ARMD age related macular degeneration; POAG primary open angle glaucoma; EBMD epithelial/anterior basement membrane dystrophy; ACIOL anterior chamber intraocular lens; IOL intraocular lens; PCIOL posterior chamber intraocular lens; Phaco/IOL phacoemulsification with intraocular lens placement; [place] photorefractive keratectomy; LASIK laser assisted in situ keratomileusis; HTN hypertension; DM diabetes mellitus; COPD chronic obstructive pulmonary disease

## 2020-05-02 NOTE — Assessment & Plan Note (Addendum)
The nature of wet macular degeneration was discussed with the patient.  Forms of therapy reviewed include the use of Anti-VEGF medications injected painlessly into the eye, as well as other possible treatment modalities, including thermal laser therapy. Fellow eye involvement and risks were discussed with the patient. Upon the finding of wet age related macular degeneration, treatment will be offered. The treatment regimen is on a treat as needed basis with the intent to treat if necessary and extend interval of exams when possible. On average 1 out of 6 patients do not need lifetime therapy. However, the risk of recurrent disease is high for a lifetime.  Initially monthly, then periodic, examinations and evaluations will determine whether the next treatment is required on the day of the examination.,   OS, with active disease, will need to commence with intravitreal therapy promptly

## 2020-05-31 ENCOUNTER — Ambulatory Visit (INDEPENDENT_AMBULATORY_CARE_PROVIDER_SITE_OTHER): Payer: Medicare HMO | Admitting: Ophthalmology

## 2020-05-31 ENCOUNTER — Other Ambulatory Visit: Payer: Self-pay

## 2020-05-31 ENCOUNTER — Encounter (INDEPENDENT_AMBULATORY_CARE_PROVIDER_SITE_OTHER): Payer: Self-pay | Admitting: Ophthalmology

## 2020-05-31 DIAGNOSIS — H353221 Exudative age-related macular degeneration, left eye, with active choroidal neovascularization: Secondary | ICD-10-CM | POA: Diagnosis not present

## 2020-05-31 MED ORDER — BEVACIZUMAB CHEMO INJECTION 1.25MG/0.05ML SYRINGE FOR KALEIDOSCOPE
1.2500 mg | INTRAVITREAL | Status: AC | PRN
  Administered 2020-05-31: 1.25 mg via INTRAVITREAL

## 2020-05-31 NOTE — Assessment & Plan Note (Signed)
The nature of wet macular degeneration was discussed with the patient.  Forms of therapy reviewed include the use of Anti-VEGF medications injected painlessly into the eye, as well as other possible treatment modalities, including thermal laser therapy. Fellow eye involvement and risks were discussed with the patient. Upon the finding of wet age related macular degeneration, treatment will be offered. The treatment regimen is on a treat as needed basis with the intent to treat if necessary and extend interval of exams when possible. On average 1 out of 6 patients do not need lifetime therapy. However, the risk of recurrent disease is high for a lifetime.  Initially monthly, then periodic, examinations and evaluations will determine whether the next treatment is required on the day of the examination.  OS now much improved after injection #1 Avastin for subfoveal CNVM and subretinal fluid.  Much less subretinal fluid.  Vision acuity limited by subretinal fibrous membrane

## 2020-05-31 NOTE — Progress Notes (Signed)
05/31/2020     CHIEF COMPLAINT Patient presents for Retina Follow Up   HISTORY OF PRESENT ILLNESS: Raven Sampson is a 86 y.o. female who presents to the clinic today for:   HPI    Retina Follow Up    Patient presents with  Wet AMD.  In both eyes.  Duration of 4 weeks.  Since onset it is stable.          Comments    4 week follow up - OCT OU, FFA L/R, FP OU, Poss Avastin OS Patient denies change in vision and overall has no complaints.        Last edited by Gerda Diss on 05/31/2020 12:56 PM. (History)      Referring physician: Earnie Larsson, PA-C [address],  [place] [address]  HISTORICAL INFORMATION:   Selected notes from the MEDICAL RECORD NUMBER       CURRENT MEDICATIONS: Current Outpatient Medications (Ophthalmic Drugs)  Medication Sig   Polyvinyl Alcohol-Povidone (REFRESH OP) Place 1 drop into both eyes daily.   No current facility-administered medications for this visit. (Ophthalmic Drugs)   Current Outpatient Medications (Other)  Medication Sig   acetaminophen (TYLENOL) 500 MG tablet Take 1,000 mg by mouth every 6 (six) hours as needed for headache (pain).   amLODipine (NORVASC) 5 MG tablet Take 1 tablet (5 mg total) by mouth daily for 14 days.   Calcium Carb-Cholecalciferol (CALCIUM 600-D PO) Take 600 mg by mouth 2 (two) times daily.   Coenzyme Q10 (COQ10 PO) Take 1 capsule by mouth daily.   diclofenac (VOLTAREN) 75 MG EC tablet Take 75 mg by mouth daily as needed (pain).   fexofenadine (ALLEGRA) 180 MG tablet Take 180 mg by mouth daily.   fluocinolone (SYNALAR) 0.01 % external solution Apply 1 application topically See admin instructions. Apply topically to scalp once daily for two weeks, hold for two weeks and then repeat   Glucosamine-Chondroitin (OSTEO BI-FLEX REGULAR STRENGTH PO) Take 1 tablet by mouth 2 (two) times daily.   lisinopril (PRINIVIL,ZESTRIL) 10 MG tablet Take 1 tablet (10 mg total) by mouth daily.     vitamin E 400 UNIT capsule Take 400 Units by mouth daily.   No current facility-administered medications for this visit. (Other)      REVIEW OF SYSTEMS:    ALLERGIES Allergies  Allergen Reactions   Codeine Nausea And Vomiting and Other (See Comments)    dizziness   Tape Other (See Comments)    Burning sensation - pls use paper tape   Ciprofloxacin Rash   Sulfa Antibiotics Nausea And Vomiting and Rash    PAST MEDICAL HISTORY Past Medical History:  Diagnosis Date   Hypertension    Leaky heart valve    Past Surgical History:  Procedure Laterality Date   ABDOMINAL HYSTERECTOMY     BLADDER SURGERY     CATARACT EXTRACTION     LEG SURGERY Left    ROTATOR CUFF REPAIR Right     FAMILY HISTORY History reviewed. No pertinent family history.  SOCIAL HISTORY Social History   Tobacco Use   Smoking status: Never Smoker   Smokeless tobacco: Never Used  Substance Use Topics   Alcohol use: Not Currently   Drug use: Not Currently         OPHTHALMIC EXAM:  Base Eye Exam    Visual Acuity (Snellen - Linear)      Right Left   Dist cc 20/40+2 20/200-1   Dist ph  cc NI NI   Correction: Glasses       Tonometry (Tonopen, 1:01 PM)      Right Left   Pressure 16 17       Pupils      Pupils Dark Light Shape React APD   Right PERRL 5 4 Round Slow None   Left PERRL 5 4 Round Slow None       Visual Fields (Counting fingers)      Left Right    Full Full       Extraocular Movement      Right Left    Full Full       Neuro/Psych    Oriented x3: Yes   Mood/Affect: Normal       Dilation    Both eyes: 1.0% Mydriacyl, 2.5% Phenylephrine @ 1:01 PM        Slit Lamp and Fundus Exam    External Exam      Right Left   External Normal Normal       Slit Lamp Exam      Right Left   Lids/Lashes Normal Normal   Conjunctiva/Sclera White and quiet White and quiet   Cornea Clear Clear   Anterior Chamber Deep and quiet Deep and quiet   Iris Round  and reactive Round and reactive   Lens Posterior chamber intraocular lens Posterior chamber intraocular lens   Anterior Vitreous Normal Normal       Fundus Exam      Right Left   Posterior Vitreous Posterior vitreous detachment Posterior vitreous detachment   Disc Normal    C/D Ratio 0.35 0.4   Macula Hard drusen, Pigmented atrophy, Intermediate age related macular degeneration, no hemorrhage, no exudates, no membrane, Retinal pigment epithelial mottling Macular thickening, Exudates, Drusen, Pigmented atrophy, Advanced age related macular degeneration, Retinal pigment epithelial mottling,, with subretinal fluid inferior to the fovea   Vessels Normal    Periphery Normal           IMAGING AND PROCEDURES  Imaging and Procedures for 05/31/20  OCT, Retina - OU - Both Eyes       Right Eye Quality was good. Scan locations included subfoveal. Central Foveal Thickness: 274. Progression has been stable.   Left Eye Quality was good. Scan locations included subfoveal. Central Foveal Thickness: 378. Progression has improved. Findings include subretinal hyper-reflective material, retinal drusen .   Notes Subretinal fluid status post Avastin injection #1  For CNVM       Color Fundus Photography Optos - OU - Both Eyes       Right Eye Progression has been stable. Disc findings include normal observations. Macula : normal observations, drusen. Vessels : normal observations. Periphery : normal observations.   Left Eye Progression has been stable. Disc findings include normal observations. Macula : drusen. Vessels : normal observations. Periphery : normal observations.        Fluorescein Angiography Optos (Transit OS)       Right Eye   Progression has been stable. Mid/Late phase findings include window defect. Choroidal neovascularization is not present.   Left Eye   Progression has improved. Early phase findings include leakage. Mid/Late phase findings include window defect,  leakage. Choroidal neovascularization is subfoveal, classic.        Intravitreal Injection, Pharmacologic Agent - OS - Left Eye       Time Out 05/31/2020. 1:51 PM. Confirmed correct patient, procedure, site, and patient consented.   Anesthesia Topical anesthesia was used. Anesthetic medications included  Akten 3.5%.   Procedure Preparation included Ofloxacin , 10% betadine to eyelids, 5% betadine to ocular surface. A 30 gauge needle was used.   Injection:  1.25 mg Bevacizumab (AVASTIN) SOLN   NDC: [address]-8, Lot: [id number]   Route: Intravitreal, Site: Left Eye, Waste: 0 mg  Post-op Post injection exam found visual acuity of at least counting fingers. The patient tolerated the procedure well. There were no complications. The patient received written and verbal post procedure care education. Post injection medications were not given.                 ASSESSMENT/PLAN:  Exudative age-related macular degeneration of left eye with active choroidal neovascularization (HCC) The nature of wet macular degeneration was discussed with the patient.  Forms of therapy reviewed include the use of Anti-VEGF medications injected painlessly into the eye, as well as other possible treatment modalities, including thermal laser therapy. Fellow eye involvement and risks were discussed with the patient. Upon the finding of wet age related macular degeneration, treatment will be offered. The treatment regimen is on a treat as needed basis with the intent to treat if necessary and extend interval of exams when possible. On average 1 out of 6 patients do not need lifetime therapy. However, the risk of recurrent disease is high for a lifetime.  Initially monthly, then periodic, examinations and evaluations will determine whether the next treatment is required on the day of the examination.  OS now much improved after injection #1 Avastin for subfoveal CNVM and subretinal fluid.  Much less subretinal fluid.   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 Exudative age-related macular degeneration of left eye with active choroidal neovascularization (HCC)  H35.3221 OCT, Retina - OU - Both Eyes    Color Fundus Photography Optos - OU - Both Eyes    Fluorescein Angiography Optos (Transit OS)    Intravitreal Injection, Pharmacologic Agent - OS - Left Eye    Bevacizumab (AVASTIN) SOLN 1.25 mg    1.  Repeat intravitreal Avastin OS,  #2 today  2.  Return visit in 5 weeks for OCT evaluation dilate OS, likely intravitreal Avastin OS  3.  Ophthalmic Meds Ordered this visit:  Meds ordered this encounter  Medications   Bevacizumab (AVASTIN) SOLN 1.25 mg       Return in about 5 weeks (around 07/05/2020) for dilate, OS, AVASTIN OCT.  There are no Patient Instructions on file for this visit.   Explained the diagnoses, plan, and follow up with the patient and they expressed understanding.  Patient expressed understanding of the importance of proper follow up care.   Clent Demark Cari Vandeberg M.D. Diseases & Surgery of the Retina and Vitreous Retina & Diabetic [place] 05/31/20     Abbreviations: M myopia (nearsighted); A astigmatism; H hyperopia (farsighted); P presbyopia; Mrx spectacle prescription;  CTL contact lenses; OD right eye; OS left eye; OU both eyes  XT exotropia; ET esotropia; PEK punctate epithelial keratitis; PEE punctate epithelial erosions; DES dry eye syndrome; MGD meibomian gland dysfunction; ATs artificial tears; PFAT's preservative free artificial tears; [place] nuclear sclerotic cataract; PSC posterior subcapsular cataract; ERM epi-retinal membrane; PVD posterior vitreous detachment; RD retinal detachment; DM diabetes mellitus; DR diabetic retinopathy; NPDR non-proliferative diabetic retinopathy; PDR proliferative diabetic retinopathy; CSME clinically significant macular edema; DME diabetic macular edema; dbh dot blot hemorrhages; CWS cotton wool spot; POAG  primary open angle glaucoma; C/D cup-to-disc ratio; HVF humphrey visual field; GVF goldmann visual  field; OCT optical coherence tomography; IOP intraocular pressure; BRVO Branch retinal vein occlusion; CRVO central retinal vein occlusion; CRAO central retinal artery occlusion; BRAO branch retinal artery occlusion; RT retinal tear; SB scleral buckle; PPV pars plana vitrectomy; VH Vitreous hemorrhage; PRP panretinal laser photocoagulation; IVK intravitreal kenalog; VMT vitreomacular traction; MH Macular hole;  NVD neovascularization of the disc; NVE neovascularization elsewhere; AREDS age related eye disease study; ARMD age related macular degeneration; POAG primary open angle glaucoma; EBMD epithelial/anterior basement membrane dystrophy; ACIOL anterior chamber intraocular lens; IOL intraocular lens; PCIOL posterior chamber intraocular lens; Phaco/IOL phacoemulsification with intraocular lens placement; [place] photorefractive keratectomy; LASIK laser assisted in situ keratomileusis; HTN hypertension; DM diabetes mellitus; COPD chronic obstructive pulmonary disease

## 2020-06-02 ENCOUNTER — Encounter (INDEPENDENT_AMBULATORY_CARE_PROVIDER_SITE_OTHER): Payer: Medicare HMO | Admitting: Ophthalmology

## 2020-07-05 ENCOUNTER — Encounter (INDEPENDENT_AMBULATORY_CARE_PROVIDER_SITE_OTHER): Payer: Self-pay | Admitting: Ophthalmology

## 2020-07-05 ENCOUNTER — Other Ambulatory Visit: Payer: Self-pay

## 2020-07-05 ENCOUNTER — Ambulatory Visit (INDEPENDENT_AMBULATORY_CARE_PROVIDER_SITE_OTHER): Payer: Medicare HMO | Admitting: Ophthalmology

## 2020-07-05 DIAGNOSIS — H353221 Exudative age-related macular degeneration, left eye, with active choroidal neovascularization: Secondary | ICD-10-CM

## 2020-07-05 DIAGNOSIS — H35722 Serous detachment of retinal pigment epithelium, left eye: Secondary | ICD-10-CM | POA: Diagnosis not present

## 2020-07-05 DIAGNOSIS — H353132 Nonexudative age-related macular degeneration, bilateral, intermediate dry stage: Secondary | ICD-10-CM | POA: Diagnosis not present

## 2020-07-05 MED ORDER — BEVACIZUMAB CHEMO INJECTION 1.25MG/0.05ML SYRINGE FOR KALEIDOSCOPE
1.2500 mg | INTRAVITREAL | Status: AC | PRN
  Administered 2020-07-05: 1.25 mg via INTRAVITREAL

## 2020-07-05 NOTE — Assessment & Plan Note (Signed)
The nature of wet macular degeneration was discussed with the patient.  Forms of therapy reviewed include the use of Anti-VEGF medications injected painlessly into the eye, as well as other possible treatment modalities, including thermal laser therapy. Fellow eye involvement and risks were discussed with the patient. Upon the finding of wet age related macular degeneration, treatment will be offered. The treatment regimen is on a treat as needed basis with the intent to treat if necessary and extend interval of exams when possible. On average 1 out of 6 patients do not need lifetime therapy. However, the risk of recurrent disease is high for a lifetime.  Initially monthly, then periodic, examinations and evaluations will determine whether the next treatment is required on the day of the examination.  Improved status post intravitreal Avastin now #2, repeat injection today and examination in 6 weeks

## 2020-07-05 NOTE — Progress Notes (Signed)
07/05/2020     CHIEF COMPLAINT Patient presents for Retina Follow Up   HISTORY OF PRESENT ILLNESS: Raven Sampson is a 86 y.o. female who presents to the clinic today for:   HPI    Retina Follow Up    Patient presents with  Wet AMD.  In left eye.  This started 5 weeks ago.  Severity is mild.  Duration of 5 weeks.  Since onset it is gradually improving.          Comments    5 Week AMD F/U OS, poss Avastin OS  Pt reports dark spot in [place] getting smaller OS. Pt reports floaters OS off and on. Pt c/o watering off and on OU.       Last edited by Rockie Neighbours, [place] on 07/05/2020  1:45 PM. (History)      Referring physician: Earnie Larsson, PA-C [address],  [place] [address]  HISTORICAL INFORMATION:   Selected notes from the MEDICAL RECORD NUMBER       CURRENT MEDICATIONS: Current Outpatient Medications (Ophthalmic Drugs)  Medication Sig  . Polyvinyl Alcohol-Povidone (REFRESH OP) Place 1 drop into both eyes daily.   No current facility-administered medications for this visit. (Ophthalmic Drugs)   Current Outpatient Medications (Other)  Medication Sig  . acetaminophen (TYLENOL) 500 MG tablet Take 1,000 mg by mouth every 6 (six) hours as needed for headache (pain).  Marland Kitchen amLODipine (NORVASC) 5 MG tablet Take 1 tablet (5 mg total) by mouth daily for 14 days.  . Calcium Carb-Cholecalciferol (CALCIUM 600-D PO) Take 600 mg by mouth 2 (two) times daily.  . Coenzyme Q10 (COQ10 PO) Take 1 capsule by mouth daily.  . diclofenac (VOLTAREN) 75 MG EC tablet Take 75 mg by mouth daily as needed (pain).  . fexofenadine (ALLEGRA) 180 MG tablet Take 180 mg by mouth daily.  . fluocinolone (SYNALAR) 0.01 % external solution Apply 1 application topically See admin instructions. Apply topically to scalp once daily for two weeks, hold for two weeks and then repeat  . Glucosamine-Chondroitin (OSTEO BI-FLEX REGULAR STRENGTH PO) Take 1 tablet by mouth 2 (two) times daily.  Marland Kitchen  lisinopril (PRINIVIL,ZESTRIL) 10 MG tablet Take 1 tablet (10 mg total) by mouth daily.  . vitamin E 400 UNIT capsule Take 400 Units by mouth daily.   No current facility-administered medications for this visit. (Other)      REVIEW OF SYSTEMS:    ALLERGIES Allergies  Allergen Reactions  . Codeine Nausea And Vomiting and Other (See Comments)    dizziness  . Tape Other (See Comments)    Burning sensation - pls use paper tape  . Ciprofloxacin Rash  . Sulfa Antibiotics Nausea And Vomiting and Rash    PAST MEDICAL HISTORY Past Medical History:  Diagnosis Date  . Hypertension   . Leaky heart valve    Past Surgical History:  Procedure Laterality Date  . ABDOMINAL HYSTERECTOMY    . BLADDER SURGERY    . CATARACT EXTRACTION    . LEG SURGERY Left   . ROTATOR CUFF REPAIR Right     FAMILY HISTORY History reviewed. No pertinent family history.  SOCIAL HISTORY Social History   Tobacco Use  . Smoking status: Never Smoker  . Smokeless tobacco: Never Used  Substance Use Topics  . Alcohol use: Not Currently  . Drug use: Not Currently         OPHTHALMIC EXAM:  Base Eye Exam    Visual Acuity (ETDRS)  Right Left   Dist cc 20/50 +2 20/200   Dist ph cc NI NI   Correction: Glasses       Tonometry (Tonopen, 1:46 PM)      Right Left   Pressure 16 20       Pupils      Pupils Dark Light Shape React APD   Right PERRL 4 3 Round Slow None   Left PERRL 4 3 Round Slow None       Visual Fields (Counting fingers)      Left Right    Full Full       Extraocular Movement      Right Left    Full Full       Neuro/Psych    Oriented x3: Yes   Mood/Affect: Normal       Dilation    Left eye: 1.0% Mydriacyl, 2.5% Phenylephrine @ 1:48 PM        Slit Lamp and Fundus Exam    External Exam      Right Left   External Normal Normal       Slit Lamp Exam      Right Left   Lids/Lashes Normal Normal   Conjunctiva/Sclera White and quiet White and quiet   Cornea  Clear Clear   Anterior Chamber Deep and quiet Deep and quiet   Iris Round and reactive Round and reactive   Lens Posterior chamber intraocular lens Posterior chamber intraocular lens   Anterior Vitreous Normal Normal       Fundus Exam      Right Left   Posterior Vitreous  Posterior vitreous detachment   C/D Ratio 0.35 0.4   Macula  Macular thickening, Exudates, Drusen, Pigmented atrophy, Advanced age related macular degeneration, Retinal pigment epithelial mottling,, with subretinal fluid inferior to the fovea          IMAGING AND PROCEDURES  Imaging and Procedures for 07/05/20  OCT, Retina - OU - Both Eyes       Right Eye Quality was good. Scan locations included subfoveal. Central Foveal Thickness: 274. Progression has been stable. Findings include no SRF, retinal drusen .   Left Eye Quality was good. Scan locations included subfoveal. Central Foveal Thickness: 361. Progression has improved. Findings include subretinal hyper-reflective material, subretinal scarring.   Notes OS, much less subretinal fluid inferior to the fovea, compared to onset of disease compared to onset of disease found May 02, 2020.  , 5 weeks post intravitreal Avastin much improved anatomy  OS       Intravitreal Injection, Pharmacologic Agent - OS - Left Eye       Time Out 07/05/2020. 2:41 PM. Confirmed correct patient, procedure, site, and patient consented.   Anesthesia Topical anesthesia was used. Anesthetic medications included Akten 3.5%.   Procedure Preparation included Tobramycin 0.3%, 10% betadine to eyelids, 5% betadine to ocular surface. A supplied needle was used.   Injection:  1.25 mg Bevacizumab (AVASTIN) SOLN   NDC: [address]-7, Lot: [id number]   Route: Intravitreal, Site: Left Eye, Waste: 0 mg  Post-op Post injection exam found visual acuity of at least counting fingers. The patient tolerated the procedure well. There were no complications. The patient received written and  verbal post procedure care education. Post injection medications were not given.                 ASSESSMENT/PLAN:  Exudative age-related macular degeneration of left eye with active choroidal neovascularization (HCC) The nature of wet macular degeneration was  discussed with the patient.  Forms of therapy reviewed include the use of Anti-VEGF medications injected painlessly into the eye, as well as other possible treatment modalities, including thermal laser therapy. Fellow eye involvement and risks were discussed with the patient. Upon the finding of wet age related macular degeneration, treatment will be offered. The treatment regimen is on a treat as needed basis with the intent to treat if necessary and extend interval of exams when possible. On average 1 out of 6 patients do not need lifetime therapy. However, the risk of recurrent disease is high for a lifetime.  Initially monthly, then periodic, examinations and evaluations will determine whether the next treatment is required on the day of the examination.  Improved status post intravitreal Avastin now #2, repeat injection today and examination in 6 weeks  Intermediate stage nonexudative age-related macular degeneration of both eyes The nature of age--related macular degeneration was discussed with the patient as well as the distinction between dry and wet types. Checking an Amsler Grid daily with advice to return immediately should a distortion develop, was given to the patient. The patient 's smoking status now and in the past was determined and advice based on the AREDS study was provided regarding the consumption of antioxidant supplements. AREDS 2 vitamin formulation was recommended. Consumption of dark leafy vegetables and fresh fruits of various colors was recommended. Treatment modalities for wet macular degeneration particularly the use of intravitreal injections of anti-blood vessel growth factors was discussed with the patient.  Avastin, Lucentis, and Eylea are the available options. On occasion, therapy includes the use of photodynamic therapy and thermal laser. Stressed to the patient do not rub eyes.  Patient was advised to check Amsler Grid daily and return immediately if changes are noted. Instructions on using the grid were given to the patient. All patient questions were answered.  Serous detachment of retinal pigment epithelium of left eye This component of the wet macular degeneration is improved dramatically and resolved yet will need ongoing evaluation monitoring and therapy      ICD-10-CM   1. Exudative age-related macular degeneration of left eye with active choroidal neovascularization (HCC)  H35.3221 OCT, Retina - OU - Both Eyes    Intravitreal Injection, Pharmacologic Agent - OS - Left Eye    Bevacizumab (AVASTIN) SOLN 1.25 mg  2. Intermediate stage nonexudative age-related macular degeneration of both eyes  H35.3132   3. Serous detachment of retinal pigment epithelium of left eye  H35.722 Intravitreal Injection, Pharmacologic Agent - OS - Left Eye    Bevacizumab (AVASTIN) SOLN 1.25 mg    1.  2.  3.  Ophthalmic Meds Ordered this visit:  Meds ordered this encounter  Medications  . Bevacizumab (AVASTIN) SOLN 1.25 mg       Return in about 6 weeks (around 08/16/2020) for dilate, OS, AVASTIN OCT.  There are no Patient Instructions on file for this visit.   Explained the diagnoses, plan, and follow up with the patient and they expressed understanding.  Patient expressed understanding of the importance of proper follow up care.   Clent Demark Ailyne Pawley M.D. Diseases & Surgery of the Retina and Vitreous Retina & Diabetic [place] 07/05/20     Abbreviations: M myopia (nearsighted); A astigmatism; H hyperopia (farsighted); P presbyopia; Mrx spectacle prescription;  CTL contact lenses; OD right eye; OS left eye; OU both eyes  XT exotropia; ET esotropia; PEK punctate epithelial keratitis; PEE  punctate epithelial erosions; DES dry eye syndrome; MGD meibomian gland dysfunction; ATs  artificial tears; PFAT's preservative free artificial tears; [place] nuclear sclerotic cataract; PSC posterior subcapsular cataract; ERM epi-retinal membrane; PVD posterior vitreous detachment; RD retinal detachment; DM diabetes mellitus; DR diabetic retinopathy; NPDR non-proliferative diabetic retinopathy; PDR proliferative diabetic retinopathy; CSME clinically significant macular edema; DME diabetic macular edema; dbh dot blot hemorrhages; CWS cotton wool spot; POAG primary open angle glaucoma; C/D cup-to-disc ratio; HVF humphrey visual field; GVF goldmann visual field; OCT optical coherence tomography; IOP intraocular pressure; BRVO Branch retinal vein occlusion; CRVO central retinal vein occlusion; CRAO central retinal artery occlusion; BRAO branch retinal artery occlusion; RT retinal tear; SB scleral buckle; PPV pars plana vitrectomy; VH Vitreous hemorrhage; PRP panretinal laser photocoagulation; IVK intravitreal kenalog; VMT vitreomacular traction; MH Macular hole;  NVD neovascularization of the disc; NVE neovascularization elsewhere; AREDS age related eye disease study; ARMD age related macular degeneration; POAG primary open angle glaucoma; EBMD epithelial/anterior basement membrane dystrophy; ACIOL anterior chamber intraocular lens; IOL intraocular lens; PCIOL posterior chamber intraocular lens; Phaco/IOL phacoemulsification with intraocular lens placement; [place] photorefractive keratectomy; LASIK laser assisted in situ keratomileusis; HTN hypertension; DM diabetes mellitus; COPD chronic obstructive pulmonary disease

## 2020-07-05 NOTE — Assessment & Plan Note (Signed)

## 2020-07-05 NOTE — Assessment & Plan Note (Signed)
This component of the wet macular degeneration is improved dramatically and resolved yet will need ongoing evaluation monitoring and therapy

## 2020-07-25 IMAGING — CT CT PELVIS W/ CM
2 of 3 series · 17 of 46 positions shown, 19 images · IV contrast (Omni 300)
Comparison: None.

CLINICAL DATA: Anal rectal abscess 1 week.

EXAM:
CT PELVIS WITH CONTRAST
TECHNIQUE: Multidetector CT imaging of the pelvis was performed using the
standard protocol following the bolus administration of intravenous
contrast.
CONTRAST:  100mL OMNIPAQUE IOHEXOL 300 MG/ML  SOLN

[Series 3: a/p w/ 5mm · axial · 0.94mm/px · z∈[-497,-187]mm · 14 of 72 slices shown, 16 images]
[im 5/72  soft-tissue]
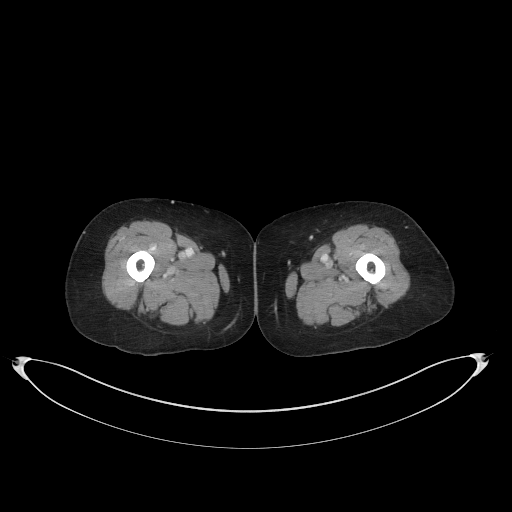
[im 5/72  bone]
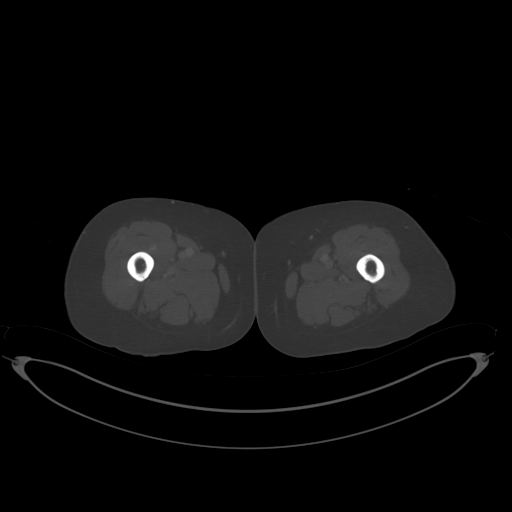
[im 10/72  soft-tissue]
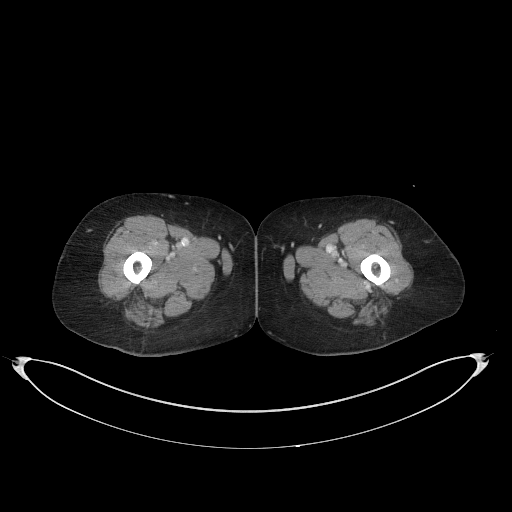
[im 14/72  soft-tissue]
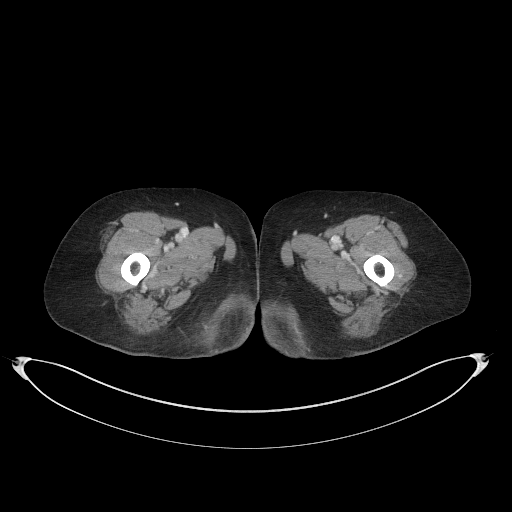
[im 19/72  soft-tissue]
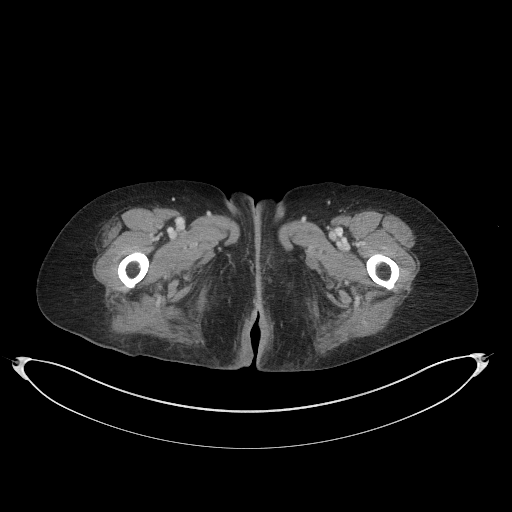
[im 23/72  soft-tissue]
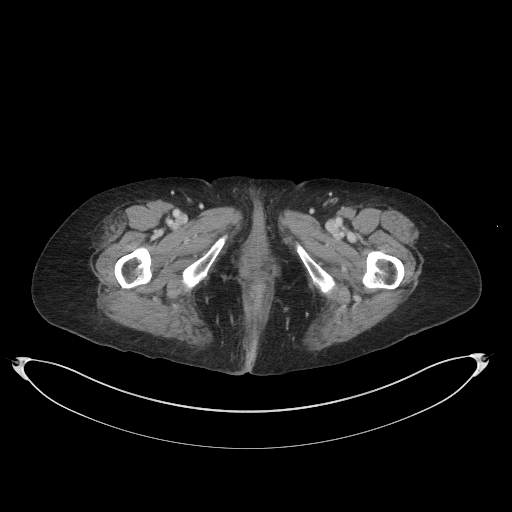
[im 28/72  soft-tissue]
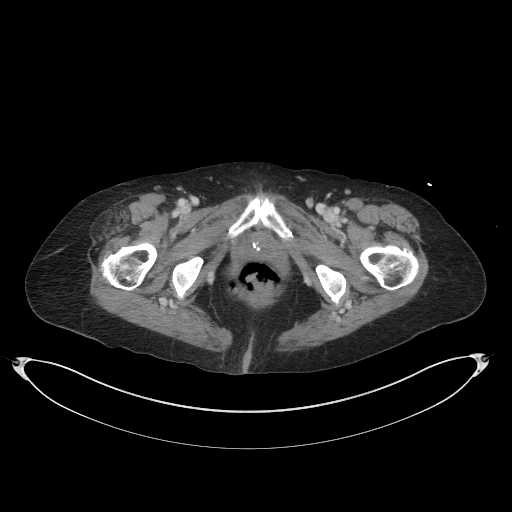
[im 33/72  soft-tissue]
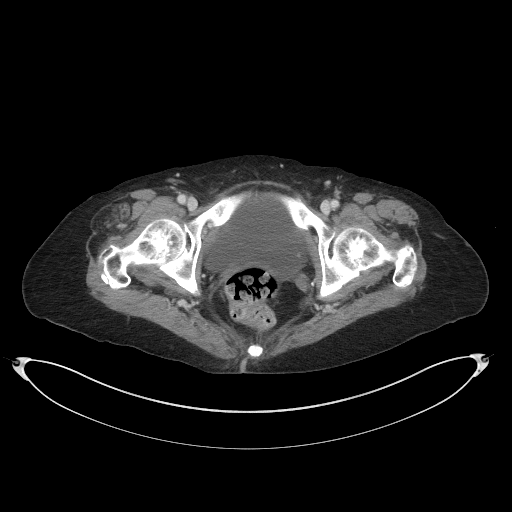
[im 39/72  soft-tissue]
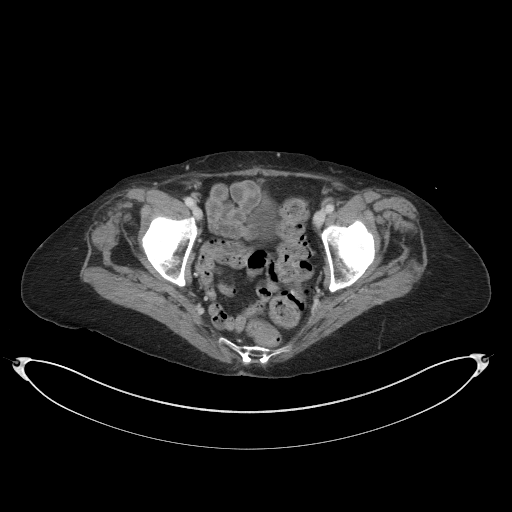
[im 44/72  soft-tissue]
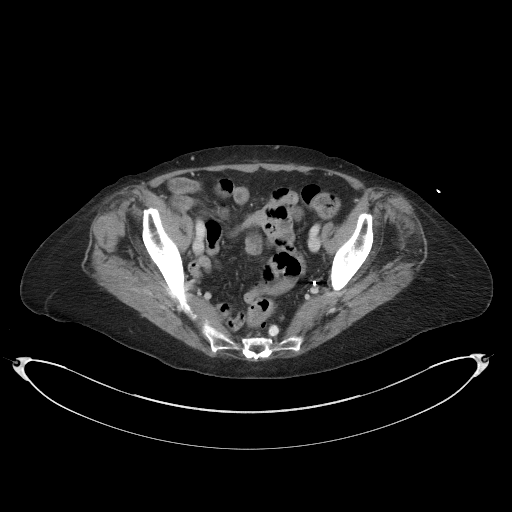
[im 44/72  bone]
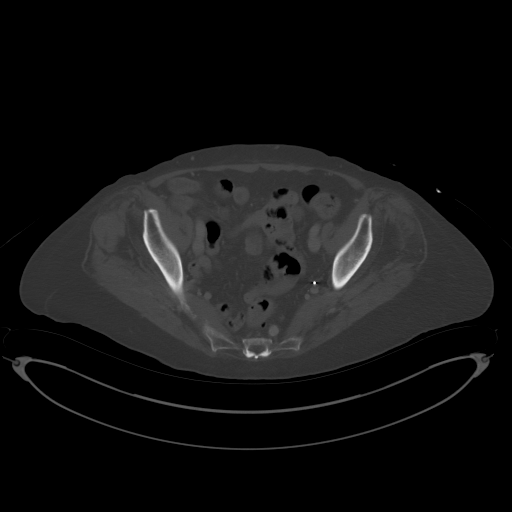
[im 49/72  soft-tissue]
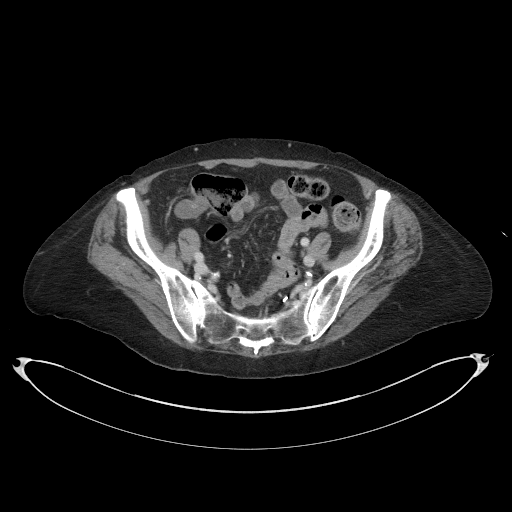
[im 53/72  soft-tissue]
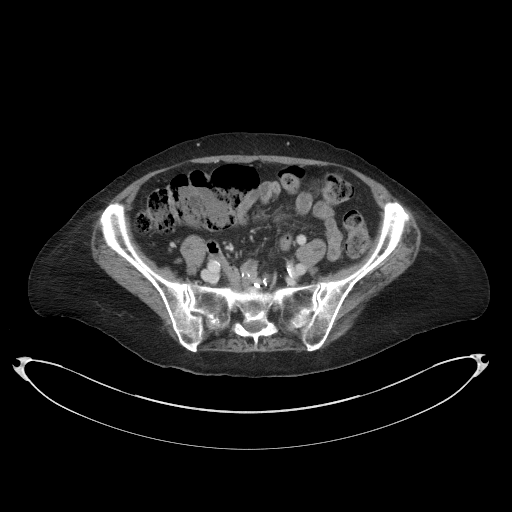
[im 58/72  soft-tissue]
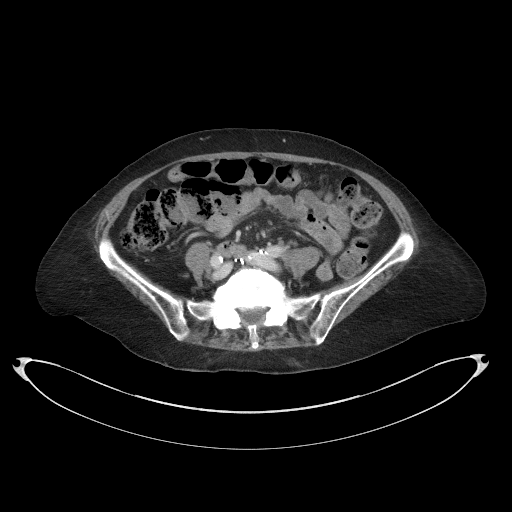
[im 62/72  soft-tissue]
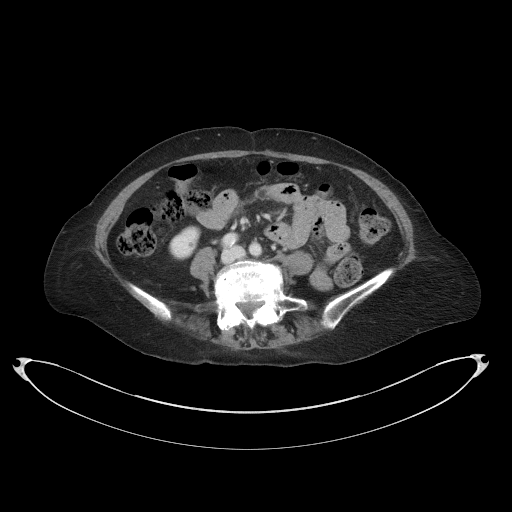
[im 67/72  soft-tissue]
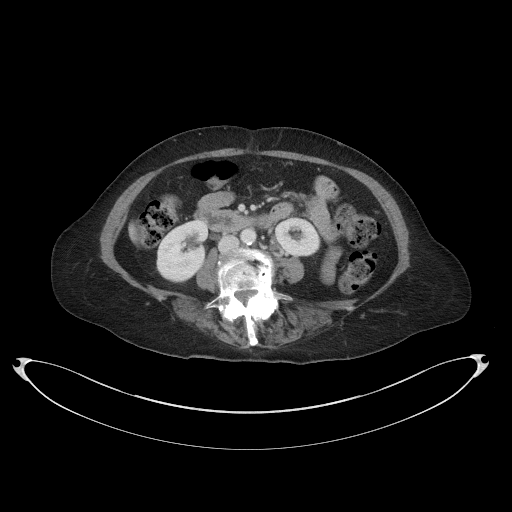

[Series 5: a/p w/ cor · coronal · 0.70mm/px · 3 of 114 slices shown]
[im 38/114  soft-tissue]
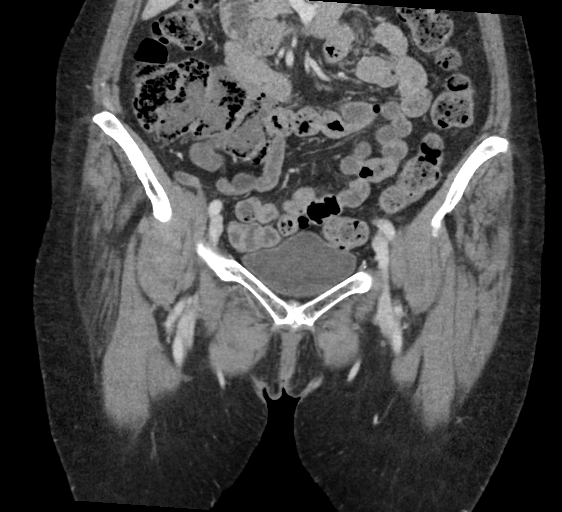
[im 51/114  soft-tissue]
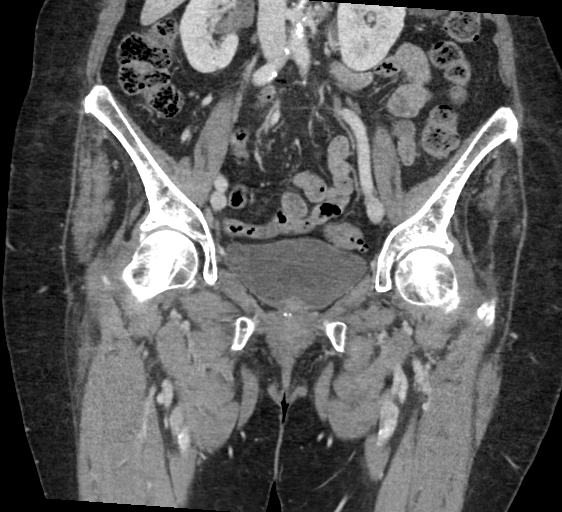
[im 63/114  soft-tissue]
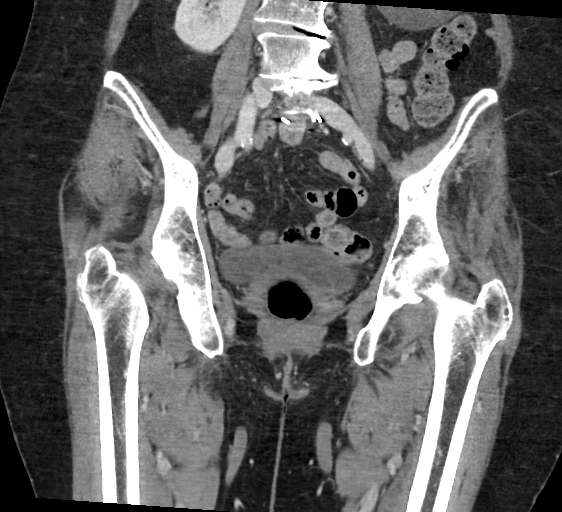

[17 of 46 positions shown; findings below may reference images not displayed]

FINDINGS: Urinary Tract: Partially visualized 6.0 cm left renal cyst. Left
renal hilum oriented anteriorly. Visualized portions of the ureters
and bladder are unremarkable.

Bowel: Minimal colonic diverticulosis. Appendix not visualized.
Visualized portions of the small bowel are normal.

Vascular/Lymphatic: Mild calcified plaque over the distal abdominal
aorta. No adenopathy.

Reproductive:  Previous hysterectomy.

Other: There is a small subcutaneous fluid collection over the lower
left gluteal region immediately left of midline below the level of
the ischial tuberosities. This measures 1 x 2.2 x 2.3 cm in
transverse, AP and craniocaudal dimension. This may represent a
subcutaneous infected versus noninfected fluid collection. No
evidence of anal rectal abscess.

Musculoskeletal: Moderate degenerate change of the lumbosacral spine
with significant disc disease over the L3-4, L4-5 and L5-S1 levels.
Mild degenerate change of the hips.
IMPRESSION: No evidence of anorectal abscess. Small subcutaneous fluid
collection adjacent the midline over the left lower gluteal crease
measuring 1.0 x 2.2 x 2.3 cm. This may represent a small
subcutaneous abscess versus noninfected fluid collection.

Mild colonic diverticulosis.

Aortic Atherosclerosis ([id number]).

Partially visualized 6 cm left renal cyst.

## 2020-08-16 ENCOUNTER — Encounter (INDEPENDENT_AMBULATORY_CARE_PROVIDER_SITE_OTHER): Payer: Self-pay | Admitting: Ophthalmology

## 2020-08-16 ENCOUNTER — Other Ambulatory Visit: Payer: Self-pay

## 2020-08-16 ENCOUNTER — Ambulatory Visit (INDEPENDENT_AMBULATORY_CARE_PROVIDER_SITE_OTHER): Payer: Medicare HMO | Admitting: Ophthalmology

## 2020-08-16 DIAGNOSIS — H353221 Exudative age-related macular degeneration, left eye, with active choroidal neovascularization: Secondary | ICD-10-CM | POA: Diagnosis not present

## 2020-08-16 DIAGNOSIS — H35722 Serous detachment of retinal pigment epithelium, left eye: Secondary | ICD-10-CM | POA: Diagnosis not present

## 2020-08-16 MED ORDER — BEVACIZUMAB CHEMO INJECTION 1.25MG/0.05ML SYRINGE FOR KALEIDOSCOPE
1.2500 mg | INTRAVITREAL | Status: AC | PRN
  Administered 2020-08-16: 1.25 mg via INTRAVITREAL

## 2020-08-16 NOTE — Progress Notes (Signed)
08/16/2020     CHIEF COMPLAINT Patient presents for Retina Follow Up   HISTORY OF PRESENT ILLNESS: Raven Sampson is a 86 y.o. female who presents to the clinic today for:   HPI    Retina Follow Up    Patient presents with  Wet AMD.  In left eye.  This started 6 weeks ago.  Severity is mild.  Duration of 6 weeks.  Since onset it is gradually improving.          Comments    6 Week AMD F/U OS, poss Avastin OS  Pt reports improving VA OS. Pt reports occasional shooting pain OS. No changes to VA OD.       Last edited by Rockie Neighbours, [place] on 08/16/2020  1:20 PM. (History)      Referring physician: Earnie Larsson, PA-C [address],  [place] [address]  HISTORICAL INFORMATION:   Selected notes from the MEDICAL RECORD NUMBER       CURRENT MEDICATIONS: Current Outpatient Medications (Ophthalmic Drugs)  Medication Sig  . Polyvinyl Alcohol-Povidone (REFRESH OP) Place 1 drop into both eyes daily.   No current facility-administered medications for this visit. (Ophthalmic Drugs)   Current Outpatient Medications (Other)  Medication Sig  . acetaminophen (TYLENOL) 500 MG tablet Take 1,000 mg by mouth every 6 (six) hours as needed for headache (pain).  Marland Kitchen amLODipine (NORVASC) 5 MG tablet Take 1 tablet (5 mg total) by mouth daily for 14 days.  . Calcium Carb-Cholecalciferol (CALCIUM 600-D PO) Take 600 mg by mouth 2 (two) times daily.  . Coenzyme Q10 (COQ10 PO) Take 1 capsule by mouth daily.  . diclofenac (VOLTAREN) 75 MG EC tablet Take 75 mg by mouth daily as needed (pain).  . fexofenadine (ALLEGRA) 180 MG tablet Take 180 mg by mouth daily.  . fluocinolone (SYNALAR) 0.01 % external solution Apply 1 application topically See admin instructions. Apply topically to scalp once daily for two weeks, hold for two weeks and then repeat  . Glucosamine-Chondroitin (OSTEO BI-FLEX REGULAR STRENGTH PO) Take 1 tablet by mouth 2 (two) times daily.  Marland Kitchen lisinopril  (PRINIVIL,ZESTRIL) 10 MG tablet Take 1 tablet (10 mg total) by mouth daily.  . vitamin E 400 UNIT capsule Take 400 Units by mouth daily.   No current facility-administered medications for this visit. (Other)      REVIEW OF SYSTEMS:    ALLERGIES Allergies  Allergen Reactions  . Codeine Nausea And Vomiting and Other (See Comments)    dizziness  . Tape Other (See Comments)    Burning sensation - pls use paper tape  . Ciprofloxacin Rash  . Sulfa Antibiotics Nausea And Vomiting and Rash    PAST MEDICAL HISTORY Past Medical History:  Diagnosis Date  . Hypertension   . Leaky heart valve    Past Surgical History:  Procedure Laterality Date  . ABDOMINAL HYSTERECTOMY    . BLADDER SURGERY    . CATARACT EXTRACTION    . LEG SURGERY Left   . ROTATOR CUFF REPAIR Right     FAMILY HISTORY History reviewed. No pertinent family history.  SOCIAL HISTORY Social History   Tobacco Use  . Smoking status: Never Smoker  . Smokeless tobacco: Never Used  Substance Use Topics  . Alcohol use: Not Currently  . Drug use: Not Currently         OPHTHALMIC EXAM:  Base Eye Exam    Visual Acuity (ETDRS)      Right Left  Dist cc 20/40 20/400   Dist ph cc NI NI   Correction: Glasses       Tonometry (Tonopen, 1:22 PM)      Right Left   Pressure 11 12       Pupils      Pupils Dark Light Shape React APD   Right PERRL 4 3 Round Slow None   Left PERRL 4 3 Round Slow None       Visual Fields (Counting fingers)      Left Right    Full Full       Extraocular Movement      Right Left    Full Full       Neuro/Psych    Oriented x3: Yes   Mood/Affect: Normal       Dilation    Left eye: 1.0% Mydriacyl, 2.5% Phenylephrine @ 1:24 PM        Slit Lamp and Fundus Exam    External Exam      Right Left   External Normal Normal       Slit Lamp Exam      Right Left   Lids/Lashes Normal Normal   Conjunctiva/Sclera White and quiet White and quiet   Cornea Clear Clear    Anterior Chamber Deep and quiet Deep and quiet   Iris Round and reactive Round and reactive   Lens Posterior chamber intraocular lens Posterior chamber intraocular lens   Anterior Vitreous Normal Normal       Fundus Exam      Right Left   Posterior Vitreous  Posterior vitreous detachment   Disc  Normal   C/D Ratio 0.35 0.4   Macula  Macular thickening, Exudates, Drusen, Pigmented atrophy, Advanced age related macular degeneration, Retinal pigment epithelial mottling,, with subretinal fluid inferior to the fovea   Vessels  Normal   Periphery  Normal          IMAGING AND PROCEDURES  Imaging and Procedures for 08/16/20  OCT, Retina - OU - Both Eyes       Right Eye Quality was good. Scan locations included subfoveal. Central Foveal Thickness: 269.   Left Eye Quality was good. Scan locations included subfoveal. Central Foveal Thickness: 354. Progression has improved. Findings include subretinal hyper-reflective material, disciform scar.   Notes OS, vastly improved anatomy as compared to June 2021 Where large serous retinal detachment extended into the fovea and inferiorly has now resolved  OD without active CNVM       Intravitreal Injection, Pharmacologic Agent - OS - Left Eye       Time Out 08/16/2020. 1:55 PM. Confirmed correct patient, procedure, site, and patient consented.   Anesthesia Topical anesthesia was used. Anesthetic medications included Akten 3.5%.   Procedure Preparation included Tobramycin 0.3%, 10% betadine to eyelids, 5% betadine to ocular surface. A supplied needle was used.   Injection:  1.25 mg Bevacizumab (AVASTIN) SOLN   NDC: [address]-0, Lot: [id number]   Route: Intravitreal, Site: Left Eye, Waste: 0 mg  Post-op Post injection exam found visual acuity of at least counting fingers. The patient tolerated the procedure well. There were no complications. The patient received written and verbal post procedure care education. Post injection  medications were not given.                 ASSESSMENT/PLAN:  Serous detachment of retinal pigment epithelium of left eye Vastly improved on therapy for wet ARMD above  Exudative age-related macular degeneration of left eye with active  choroidal neovascularization (HCC) Improved anatomy, subfoveal scarring accounts for acuity, improved still at 6-week interval, will repeat injection today and examination in 7 weeks      ICD-10-CM   1. Exudative age-related macular degeneration of left eye with active choroidal neovascularization (HCC)  H35.3221 OCT, Retina - OU - Both Eyes    Intravitreal Injection, Pharmacologic Agent - OS - Left Eye    Bevacizumab (AVASTIN) SOLN 1.25 mg  2. Serous detachment of retinal pigment epithelium of left eye  H35.722     1.  Improved macular condition and findings left eye on examination and OCT, repeat intravitreal Avastin today at 6-week interval examination repeat OS in 7 weeks  2.  3.  Ophthalmic Meds Ordered this visit:  Meds ordered this encounter  Medications  . Bevacizumab (AVASTIN) SOLN 1.25 mg       Return in about 7 weeks (around 10/04/2020) for dilate, OS, AVASTIN OCT.  Patient Instructions  Patient instructed to notify the office promptly for new onset visual acuity declines or distortions    Explained the diagnoses, plan, and follow up with the patient and they expressed understanding.  Patient expressed understanding of the importance of proper follow up care.   Clent Demark Donicia Druck M.D. Diseases & Surgery of the Retina and Vitreous Retina [facility] Diabetic [place] 08/16/20     Abbreviations: M myopia (nearsighted); A astigmatism; H hyperopia (farsighted); P presbyopia; Mrx spectacle prescription;  CTL contact lenses; OD right eye; OS left eye; OU both eyes  XT exotropia; ET esotropia; PEK punctate epithelial keratitis; PEE punctate epithelial erosions; DES dry eye syndrome; MGD meibomian gland dysfunction; ATs artificial  tears; PFAT's preservative free artificial tears; [place] nuclear sclerotic cataract; PSC posterior subcapsular cataract; ERM epi-retinal membrane; PVD posterior vitreous detachment; RD retinal detachment; DM diabetes mellitus; DR diabetic retinopathy; NPDR non-proliferative diabetic retinopathy; PDR proliferative diabetic retinopathy; CSME clinically significant macular edema; DME diabetic macular edema; dbh dot blot hemorrhages; CWS cotton wool spot; POAG primary open angle glaucoma; C/D cup-to-disc ratio; HVF humphrey visual field; GVF goldmann visual field; OCT optical coherence tomography; IOP intraocular pressure; BRVO Branch retinal vein occlusion; CRVO central retinal vein occlusion; CRAO central retinal artery occlusion; BRAO branch retinal artery occlusion; RT retinal tear; SB scleral buckle; PPV pars plana vitrectomy; VH Vitreous hemorrhage; PRP panretinal laser photocoagulation; IVK intravitreal kenalog; VMT vitreomacular traction; MH Macular hole;  NVD neovascularization of the disc; NVE neovascularization elsewhere; AREDS age related eye disease study; ARMD age related macular degeneration; POAG primary open angle glaucoma; EBMD epithelial/anterior basement membrane dystrophy; ACIOL anterior chamber intraocular lens; IOL intraocular lens; PCIOL posterior chamber intraocular lens; Phaco/IOL phacoemulsification with intraocular lens placement; [place] photorefractive keratectomy; LASIK laser assisted in situ keratomileusis; HTN hypertension; DM diabetes mellitus; COPD chronic obstructive pulmonary disease

## 2020-08-16 NOTE — Patient Instructions (Signed)
Patient instructed to notify the office promptly for new onset visual acuity declines or distortions

## 2020-08-16 NOTE — Assessment & Plan Note (Signed)
Vastly improved on therapy for wet ARMD above

## 2020-08-16 NOTE — Assessment & Plan Note (Signed)
Improved anatomy, subfoveal scarring accounts for acuity, improved still at 6-week interval, will repeat injection today and examination in 7 weeks

## 2020-10-04 ENCOUNTER — Encounter (INDEPENDENT_AMBULATORY_CARE_PROVIDER_SITE_OTHER): Payer: Self-pay | Admitting: Ophthalmology

## 2020-10-04 ENCOUNTER — Other Ambulatory Visit: Payer: Self-pay

## 2020-10-04 ENCOUNTER — Ambulatory Visit (INDEPENDENT_AMBULATORY_CARE_PROVIDER_SITE_OTHER): Payer: Medicare HMO | Admitting: Ophthalmology

## 2020-10-04 DIAGNOSIS — H353132 Nonexudative age-related macular degeneration, bilateral, intermediate dry stage: Secondary | ICD-10-CM

## 2020-10-04 DIAGNOSIS — H35722 Serous detachment of retinal pigment epithelium, left eye: Secondary | ICD-10-CM

## 2020-10-04 DIAGNOSIS — H353221 Exudative age-related macular degeneration, left eye, with active choroidal neovascularization: Secondary | ICD-10-CM

## 2020-10-04 MED ORDER — BEVACIZUMAB CHEMO INJECTION 1.25MG/0.05ML SYRINGE FOR KALEIDOSCOPE
1.2500 mg | INTRAVITREAL | Status: AC | PRN
  Administered 2020-10-04: 1.25 mg via INTRAVITREAL

## 2020-10-04 NOTE — Assessment & Plan Note (Signed)
Much less active CNVM with residual subfoveal fibrosis accounting for acuity but much less subretinal fluid.  Currently at 7-week follow-up interval.  Repeat injection Avastin today  Extend interval examination next 8 weeks

## 2020-10-04 NOTE — Assessment & Plan Note (Signed)
OD with no progression to wet AMD

## 2020-10-04 NOTE — Assessment & Plan Note (Signed)
Much improved and resolved since onset of therapy June 2021

## 2020-10-04 NOTE — Progress Notes (Signed)
10/04/2020     CHIEF COMPLAINT Patient presents for Retina Follow Up   HISTORY OF PRESENT ILLNESS: Raven Sampson is a 86 y.o. female who presents to the clinic today for:   HPI    Retina Follow Up    Patient presents with  Wet AMD.  In left eye.  This started 7 weeks ago.  Duration of weeks.  Since onset it is stable.          Comments    7 WK F/U OS, POSS AVASTIN OS  Stable vision, floaters have improved, no pain          Last edited by Nichola Sizer D on 10/04/2020  1:31 PM. (History)      Referring physician: Earnie Larsson, PA-C [address],  [place] [address]  HISTORICAL INFORMATION:   Selected notes from the MEDICAL RECORD NUMBER       CURRENT MEDICATIONS: Current Outpatient Medications (Ophthalmic Drugs)  Medication Sig   Polyvinyl Alcohol-Povidone (REFRESH OP) Place 1 drop into both eyes daily.   No current facility-administered medications for this visit. (Ophthalmic Drugs)   Current Outpatient Medications (Other)  Medication Sig   acetaminophen (TYLENOL) 500 MG tablet Take 1,000 mg by mouth every 6 (six) hours as needed for headache (pain).   amLODipine (NORVASC) 5 MG tablet Take 1 tablet (5 mg total) by mouth daily for 14 days.   Calcium Carb-Cholecalciferol (CALCIUM 600-D PO) Take 600 mg by mouth 2 (two) times daily.   Coenzyme Q10 (COQ10 PO) Take 1 capsule by mouth daily.   diclofenac (VOLTAREN) 75 MG EC tablet Take 75 mg by mouth daily as needed (pain).   fexofenadine (ALLEGRA) 180 MG tablet Take 180 mg by mouth daily.   fluocinolone (SYNALAR) 0.01 % external solution Apply 1 application topically See admin instructions. Apply topically to scalp once daily for two weeks, hold for two weeks and then repeat   Glucosamine-Chondroitin (OSTEO BI-FLEX REGULAR STRENGTH PO) Take 1 tablet by mouth 2 (two) times daily.   lisinopril (PRINIVIL,ZESTRIL) 10 MG tablet Take 1 tablet (10 mg total) by mouth daily.    vitamin E 400 UNIT capsule Take 400 Units by mouth daily.   No current facility-administered medications for this visit. (Other)      REVIEW OF SYSTEMS:    ALLERGIES Allergies  Allergen Reactions   Codeine Nausea And Vomiting and Other (See Comments)    dizziness   Tape Other (See Comments)    Burning sensation - pls use paper tape   Ciprofloxacin Rash   Sulfa Antibiotics Nausea And Vomiting and Rash    PAST MEDICAL HISTORY Past Medical History:  Diagnosis Date   Hypertension    Leaky heart valve    Past Surgical History:  Procedure Laterality Date   ABDOMINAL HYSTERECTOMY     BLADDER SURGERY     CATARACT EXTRACTION     LEG SURGERY Left    ROTATOR CUFF REPAIR Right     FAMILY HISTORY History reviewed. No pertinent family history.  SOCIAL HISTORY Social History   Tobacco Use   Smoking status: Never Smoker   Smokeless tobacco: Never Used  Substance Use Topics   Alcohol use: Not Currently   Drug use: Not Currently         OPHTHALMIC EXAM:  Base Eye Exam    Visual Acuity (ETDRS)      Right Left   Dist cc 20/40 20/400   Dist ph cc NI NI  Correction: Glasses       Tonometry (Tonopen, 1:34 PM)      Right Left   Pressure 16 15       Pupils      Pupils Dark Light Shape React APD   Right PERRL 4 3 Round Slow None   Left PERRL 4 3 Round Slow None       Visual Fields (Counting fingers)      Left Right    Full Full       Extraocular Movement      Right Left    Full Full       Neuro/Psych    Oriented x3: Yes   Mood/Affect: Normal       Dilation    Left eye: 1.0% Mydriacyl, 2.5% Phenylephrine @ 1:36 PM        Slit Lamp and Fundus Exam    External Exam      Right Left   External Normal Normal       Slit Lamp Exam      Right Left   Lids/Lashes Normal Normal   Conjunctiva/Sclera White and quiet White and quiet   Cornea Clear Clear   Anterior Chamber Deep and quiet Deep and quiet   Iris Round and reactive Round  and reactive   Lens Posterior chamber intraocular lens Posterior chamber intraocular lens   Anterior Vitreous Normal Normal       Fundus Exam      Right Left   Posterior Vitreous  Posterior vitreous detachment   Disc  Normal   C/D Ratio 0.35 0.4   Macula  Macular thickening,  Drusen, Pigmented atrophy, Advanced age related macular degeneration, Retinal pigment epithelial mottling,, no exudates, Disciform scar   Vessels  Normal   Periphery  Normal          IMAGING AND PROCEDURES  Imaging and Procedures for 10/04/20  OCT, Retina - OU - Both Eyes       Right Eye Quality was good. Scan locations included subfoveal. Central Foveal Thickness: 278. Progression has been stable. Findings include abnormal foveal contour, retinal drusen , no IRF, no SRF.   Left Eye Quality was good. Scan locations included subfoveal. Central Foveal Thickness: 348. Findings include no SRF, abnormal foveal contour, no IRF, disciform scar.   Notes Much less disease activity left eye as compared to onset of disease and treatment June 2021       Intravitreal Injection, Pharmacologic Agent - OS - Left Eye       Time Out 10/04/2020. 2:07 PM. Confirmed correct patient, procedure, site, and patient consented.   Anesthesia Topical anesthesia was used. Anesthetic medications included Akten 3.5%.   Procedure Preparation included Tobramycin 0.3%, 10% betadine to eyelids, 5% betadine to ocular surface. A 30 gauge needle was used.   Injection:  1.25 mg Bevacizumab (AVASTIN) SOLN   NDC: 70360-001-02, Lot: [id number]   Route: Intravitreal, Site: Left Eye, Waste: 0 mg  Post-op Post injection exam found visual acuity of at least counting fingers. The patient tolerated the procedure well. There were no complications. The patient received written and verbal post procedure care education. Post injection medications were not given.                 ASSESSMENT/PLAN:  Exudative age-related macular  degeneration of left eye with active choroidal neovascularization (HCC) Much less active CNVM with residual subfoveal fibrosis accounting for acuity but much less subretinal fluid.  Currently at 7-week follow-up interval.  Repeat injection  Avastin today  Extend interval examination next 8 weeks  Serous detachment of retinal pigment epithelium of left eye Much improved and resolved since onset of therapy June 2021  Intermediate stage nonexudative age-related macular degeneration of both eyes OD with no progression to wet AMD      ICD-10-CM   1. Exudative age-related macular degeneration of left eye with active choroidal neovascularization (HCC)  H35.3221 OCT, Retina - OU - Both Eyes    Intravitreal Injection, Pharmacologic Agent - OS - Left Eye    Bevacizumab (AVASTIN) SOLN 1.25 mg  2. Serous detachment of retinal pigment epithelium of left eye  H35.722   3. Intermediate stage nonexudative age-related macular degeneration of both eyes  H35.3132     1.  Less active wet ARMD OS with much less and smaller serous retinal detachment.  Currently at 7-week follow-up.  Acuity limited by subfoveal fibrosis.  Will extend exam interval to 8 weeks.  2.  3.  Ophthalmic Meds Ordered this visit:  Meds ordered this encounter  Medications   Bevacizumab (AVASTIN) SOLN 1.25 mg       Return in about 8 weeks (around 11/29/2020) for dilate, OS, AVASTIN OCT.  Patient Instructions  Patient instructed to promptly report new onset visual acuity decline or distortion    Explained the diagnoses, plan, and follow up with the patient and they expressed understanding.  Patient expressed understanding of the importance of proper follow up care.   Clent Demark Tavaras Goody M.D. Diseases & Surgery of the Retina and Vitreous Retina & Diabetic [place] 10/04/20     Abbreviations: M myopia (nearsighted); A astigmatism; H hyperopia (farsighted); P presbyopia; Mrx spectacle prescription;  CTL contact lenses; OD  right eye; OS left eye; OU both eyes  XT exotropia; ET esotropia; PEK punctate epithelial keratitis; PEE punctate epithelial erosions; DES dry eye syndrome; MGD meibomian gland dysfunction; ATs artificial tears; PFAT's preservative free artificial tears; [place] nuclear sclerotic cataract; PSC posterior subcapsular cataract; ERM epi-retinal membrane; PVD posterior vitreous detachment; RD retinal detachment; DM diabetes mellitus; DR diabetic retinopathy; NPDR non-proliferative diabetic retinopathy; PDR proliferative diabetic retinopathy; CSME clinically significant macular edema; DME diabetic macular edema; dbh dot blot hemorrhages; CWS cotton wool spot; POAG primary open angle glaucoma; C/D cup-to-disc ratio; HVF humphrey visual field; GVF goldmann visual field; OCT optical coherence tomography; IOP intraocular pressure; BRVO Branch retinal vein occlusion; CRVO central retinal vein occlusion; CRAO central retinal artery occlusion; BRAO branch retinal artery occlusion; RT retinal tear; SB scleral buckle; PPV pars plana vitrectomy; VH Vitreous hemorrhage; PRP panretinal laser photocoagulation; IVK intravitreal kenalog; VMT vitreomacular traction; MH Macular hole;  NVD neovascularization of the disc; NVE neovascularization elsewhere; AREDS age related eye disease study; ARMD age related macular degeneration; POAG primary open angle glaucoma; EBMD epithelial/anterior basement membrane dystrophy; ACIOL anterior chamber intraocular lens; IOL intraocular lens; PCIOL posterior chamber intraocular lens; Phaco/IOL phacoemulsification with intraocular lens placement; [place] photorefractive keratectomy; LASIK laser assisted in situ keratomileusis; HTN hypertension; DM diabetes mellitus; COPD chronic obstructive pulmonary disease

## 2020-10-04 NOTE — Patient Instructions (Signed)
Patient instructed to promptly report new onset visual acuity decline or distortion

## 2020-11-29 ENCOUNTER — Other Ambulatory Visit: Payer: Self-pay

## 2020-11-29 ENCOUNTER — Encounter (INDEPENDENT_AMBULATORY_CARE_PROVIDER_SITE_OTHER): Payer: Self-pay | Admitting: Ophthalmology

## 2020-11-29 ENCOUNTER — Ambulatory Visit (INDEPENDENT_AMBULATORY_CARE_PROVIDER_SITE_OTHER): Payer: Medicare HMO | Admitting: Ophthalmology

## 2020-11-29 DIAGNOSIS — H353132 Nonexudative age-related macular degeneration, bilateral, intermediate dry stage: Secondary | ICD-10-CM | POA: Diagnosis not present

## 2020-11-29 DIAGNOSIS — H353221 Exudative age-related macular degeneration, left eye, with active choroidal neovascularization: Secondary | ICD-10-CM

## 2020-11-29 MED ORDER — BEVACIZUMAB 2.5 MG/0.1ML IZ SOSY
2.5000 mg | PREFILLED_SYRINGE | INTRAVITREAL | Status: AC | PRN
  Administered 2020-11-29: 2.5 mg via INTRAVITREAL

## 2020-11-29 NOTE — Patient Instructions (Signed)
Patient instructed to contact the office promptly for new onset visual acuity decline or distortion 

## 2020-11-29 NOTE — Progress Notes (Signed)
11/29/2020     CHIEF COMPLAINT Patient presents for Retina Follow Up (8 WK FU OS, POSS AVASTIN OS////Pt reports stable vision OS, no new F/F OS, no pain or pressure OS. )   HISTORY OF PRESENT ILLNESS: Raven Sampson is a 87 y.o. female who presents to the clinic today for:   HPI    Retina Follow Up    Patient presents with  Wet AMD.  In left eye.  This started 8 weeks ago.  Severity is mild.  Duration of 8 weeks.  Since onset it is stable. Additional comments: 8 WK FU OS, POSS AVASTIN OS    Pt reports stable vision OS, no new F/F OS, no pain or pressure OS.        Last edited by Nichola Sizer D on 11/29/2020  1:34 PM. (History)      Referring physician: Earnie Larsson, PA-C [address] [place],  [place] [phone]  HISTORICAL INFORMATION:   Selected notes from the MEDICAL RECORD NUMBER       CURRENT MEDICATIONS: Current Outpatient Medications (Ophthalmic Drugs)  Medication Sig  . Polyvinyl Alcohol-Povidone (REFRESH OP) Place 1 drop into both eyes daily.   No current facility-administered medications for this visit. (Ophthalmic Drugs)   Current Outpatient Medications (Other)  Medication Sig  . acetaminophen (TYLENOL) 500 MG tablet Take 1,000 mg by mouth every 6 (six) hours as needed for headache (pain).  Marland Kitchen amLODipine (NORVASC) 5 MG tablet Take 1 tablet (5 mg total) by mouth daily for 14 days.  . Calcium Carb-Cholecalciferol (CALCIUM 600-D PO) Take 600 mg by mouth 2 (two) times daily.  . Coenzyme Q10 (COQ10 PO) Take 1 capsule by mouth daily.  . diclofenac (VOLTAREN) 75 MG EC tablet Take 75 mg by mouth daily as needed (pain).  . fexofenadine (ALLEGRA) 180 MG tablet Take 180 mg by mouth daily.  . fluocinolone (SYNALAR) 0.01 % external solution Apply 1 application topically See admin instructions. Apply topically to scalp once daily for two weeks, hold for two weeks and then repeat  . Glucosamine-Chondroitin (OSTEO BI-FLEX REGULAR STRENGTH PO) Take 1  tablet by mouth 2 (two) times daily.  Marland Kitchen lisinopril (PRINIVIL,ZESTRIL) 10 MG tablet Take 1 tablet (10 mg total) by mouth daily.  . vitamin E 400 UNIT capsule Take 400 Units by mouth daily.   No current facility-administered medications for this visit. (Other)      REVIEW OF SYSTEMS:    ALLERGIES Allergies  Allergen Reactions  . Codeine Nausea And Vomiting and Other (See Comments)    dizziness  . Tape Other (See Comments)    Burning sensation - pls use paper tape  . Ciprofloxacin Rash  . Sulfa Antibiotics Nausea And Vomiting and Rash    PAST MEDICAL HISTORY Past Medical History:  Diagnosis Date  . Hypertension   . Leaky heart valve    Past Surgical History:  Procedure Laterality Date  . ABDOMINAL HYSTERECTOMY    . BLADDER SURGERY    . CATARACT EXTRACTION    . LEG SURGERY Left   . ROTATOR CUFF REPAIR Right     FAMILY HISTORY History reviewed. No pertinent family history.  SOCIAL HISTORY Social History   Tobacco Use  . Smoking status: Never Smoker  . Smokeless tobacco: Never Used  Substance Use Topics  . Alcohol use: Not Currently  . Drug use: Not Currently         OPHTHALMIC EXAM:  Base Eye Exam    Visual  Acuity (ETDRS)      Right Left   Dist cc 20/40 E card @ 3 ft"   Dist ph cc NI NI   Correction: Glasses       Tonometry (Tonopen, 1:40 PM)      Right Left   Pressure 17 14       Pupils      Pupils Dark Light Shape React APD   Right PERRL 4 3 Round Slow None   Left PERRL 4 3 Round Slow None       Visual Fields (Counting fingers)      Left Right    Full Full       Extraocular Movement      Right Left    Full Full       Neuro/Psych    Oriented x3: Yes   Mood/Affect: Normal       Dilation    Left eye: 1.0% Mydriacyl, 2.5% Phenylephrine @ 1:40 PM        Slit Lamp and Fundus Exam    External Exam      Right Left   External Normal Normal       Slit Lamp Exam      Right Left   Lids/Lashes Normal Normal   Conjunctiva/Sclera  White and quiet White and quiet   Cornea Clear Clear   Anterior Chamber Deep and quiet Deep and quiet   Iris Round and reactive Round and reactive   Lens Posterior chamber intraocular lens Posterior chamber intraocular lens   Anterior Vitreous Normal Normal       Fundus Exam      Right Left   Posterior Vitreous  Posterior vitreous detachment   Disc  Normal   C/D Ratio 0.35 0.4   Macula  Macular thickening,  Drusen, Pigmented atrophy, Advanced age related macular degeneration, Retinal pigment epithelial mottling,, no exudates, Disciform scar no hemorrhage   Vessels  Normal   Periphery  Normal          IMAGING AND PROCEDURES  Imaging and Procedures for 11/29/20  OCT, Retina - OU - Both Eyes       Right Eye Quality was good. Scan locations included subfoveal. Central Foveal Thickness: 283. Findings include normal foveal contour, no IRF, retinal drusen , no SRF.   Left Eye Quality was good. Scan locations included subfoveal. Central Foveal Thickness: 319. Progression has improved. Findings include abnormal foveal contour, disciform scar, no SRF, no IRF.   Notes OS much improved anatomy as compared to onset of CNVM and fibrous scarring June 2021.  Currently at 8-week follow-up.  We will repeat a injection today and examination in 9 weeks       Intravitreal Injection, Pharmacologic Agent - OS - Left Eye       Time Out 11/29/2020. 2:05 PM. Confirmed correct patient, procedure, site, and patient consented.   Anesthesia Topical anesthesia was used. Anesthetic medications included Akten 3.5%.   Procedure Preparation included Tobramycin 0.3%, 10% betadine to eyelids, 5% betadine to ocular surface. A 30 gauge needle was used.   Injection:  2.5 mg Bevacizumab (AVASTIN) 2.5mg /0.31mL SOSY   NDC: 68341-962-22, Lot: [id number]   Route: Intravitreal, Site: Left Eye  Post-op Post injection exam found visual acuity of at least counting fingers. The patient tolerated the procedure  well. There were no complications. The patient received written and verbal post procedure care education. Post injection medications were not given.  ASSESSMENT/PLAN:  Intermediate stage nonexudative age-related macular degeneration of both eyes OD stable by OCT we will continue to monitor dilate OD and OU as next  Exudative age-related macular degeneration of left eye with active choroidal neovascularization ([place]) Much improved anatomy as compared to onset some 7 months prior, currently at 8-week follow-up.  We will repeat injection today and examination again left eye in 9 weeks  OCT photos reviewed with patient and family      ICD-10-CM   1. Exudative age-related macular degeneration of left eye with active choroidal neovascularization (HCC)  H35.3221 OCT, Retina - OU - Both Eyes    Intravitreal Injection, Pharmacologic Agent - OS - Left Eye    bevacizumab (AVASTIN) SOSY 2.5 mg  2. Intermediate stage nonexudative age-related macular degeneration of both eyes  H35.3132     1.  Repeat injection intravitreal Avastin OS today, 8-week interval  2.  Follow-up OU, dilate OU 9 weeks, possible Avastin injection OS  3.  Ophthalmic Meds Ordered this visit:  Meds ordered this encounter  Medications  . bevacizumab (AVASTIN) SOSY 2.5 mg       Return in about 9 weeks (around 01/31/2021) for DILATE OU, AVASTIN OCT, OS.  Patient Instructions  Patient instructed to contact the office promptly for new onset visual acuity decline or distortion    Explained the diagnoses, plan, and follow up with the patient and they expressed understanding.  Patient expressed understanding of the importance of proper follow up care.   Clent Demark Gini Caputo M.D. Diseases & Surgery of the Retina and Vitreous Retina & Diabetic [place] 11/29/20     Abbreviations: M myopia (nearsighted); A astigmatism; H hyperopia (farsighted); P presbyopia; Mrx spectacle prescription;  CTL contact  lenses; OD right eye; OS left eye; OU both eyes  XT exotropia; ET esotropia; PEK punctate epithelial keratitis; PEE punctate epithelial erosions; DES dry eye syndrome; MGD meibomian gland dysfunction; ATs artificial tears; PFAT's preservative free artificial tears; [place] nuclear sclerotic cataract; PSC posterior subcapsular cataract; ERM epi-retinal membrane; PVD posterior vitreous detachment; RD retinal detachment; DM diabetes mellitus; DR diabetic retinopathy; NPDR non-proliferative diabetic retinopathy; PDR proliferative diabetic retinopathy; CSME clinically significant macular edema; DME diabetic macular edema; dbh dot blot hemorrhages; CWS cotton wool spot; POAG primary open angle glaucoma; C/D cup-to-disc ratio; HVF humphrey visual field; GVF goldmann visual field; OCT optical coherence tomography; IOP intraocular pressure; BRVO Branch retinal vein occlusion; CRVO central retinal vein occlusion; CRAO central retinal artery occlusion; BRAO branch retinal artery occlusion; RT retinal tear; SB scleral buckle; PPV pars plana vitrectomy; VH Vitreous hemorrhage; PRP panretinal laser photocoagulation; IVK intravitreal kenalog; VMT vitreomacular traction; MH Macular hole;  NVD neovascularization of the disc; NVE neovascularization elsewhere; AREDS age related eye disease study; ARMD age related macular degeneration; POAG primary open angle glaucoma; EBMD epithelial/anterior basement membrane dystrophy; ACIOL anterior chamber intraocular lens; IOL intraocular lens; PCIOL posterior chamber intraocular lens; Phaco/IOL phacoemulsification with intraocular lens placement; [place] photorefractive keratectomy; LASIK laser assisted in situ keratomileusis; HTN hypertension; DM diabetes mellitus; COPD chronic obstructive pulmonary disease

## 2020-11-29 NOTE — Assessment & Plan Note (Addendum)
Much improved anatomy as compared to onset some 7 months prior, currently at 8-week follow-up.  We will repeat injection today and examination again left eye in 9 weeks  OCT photos reviewed with patient and family

## 2020-11-29 NOTE — Assessment & Plan Note (Signed)
OD stable by OCT we will continue to monitor dilate OD and OU as next

## 2021-02-01 ENCOUNTER — Encounter (INDEPENDENT_AMBULATORY_CARE_PROVIDER_SITE_OTHER): Payer: Self-pay | Admitting: Ophthalmology

## 2021-02-01 ENCOUNTER — Ambulatory Visit (INDEPENDENT_AMBULATORY_CARE_PROVIDER_SITE_OTHER): Payer: Medicare HMO | Admitting: Ophthalmology

## 2021-02-01 ENCOUNTER — Other Ambulatory Visit: Payer: Self-pay

## 2021-02-01 DIAGNOSIS — H35722 Serous detachment of retinal pigment epithelium, left eye: Secondary | ICD-10-CM

## 2021-02-01 DIAGNOSIS — H353132 Nonexudative age-related macular degeneration, bilateral, intermediate dry stage: Secondary | ICD-10-CM

## 2021-02-01 DIAGNOSIS — H353221 Exudative age-related macular degeneration, left eye, with active choroidal neovascularization: Secondary | ICD-10-CM

## 2021-02-01 MED ORDER — BEVACIZUMAB 2.5 MG/0.1ML IZ SOSY
2.5000 mg | PREFILLED_SYRINGE | INTRAVITREAL | Status: AC | PRN
  Administered 2021-02-01: 2.5 mg via INTRAVITREAL

## 2021-02-01 NOTE — Assessment & Plan Note (Signed)
Stable condition at 9-week follow-up.  Repeat injection today and extend interval examination to 11 weeks

## 2021-02-01 NOTE — Progress Notes (Signed)
02/01/2021     CHIEF COMPLAINT Patient presents for Retina Follow Up (9 Week Wet AMD f\u. Possible Avastin OS. OCT/Pt states she does not see well out of OS. Denies changes in vision. Pt states she still sees her regular floaters. Denies FOL.)   HISTORY OF PRESENT ILLNESS: Raven Sampson is a 87 y.o. female who presents to the clinic today for:   HPI    Retina Follow Up    Patient presents with  Wet AMD.  In left eye.  Severity is moderate.  Duration of 9 weeks.  Since onset it is stable.  I, the attending physician,  performed the HPI with the patient and updated documentation appropriately. Additional comments: 9 Week Wet AMD f\u. Possible Avastin OS. OCT Pt states she does not see well out of OS. Denies changes in vision. Pt states she still sees her regular floaters. Denies FOL.       Last edited by Tilda Franco on 02/01/2021  1:18 PM. (History)      Referring physician: Earnie Larsson, PA-C [address] [place],  [place] [address]  HISTORICAL INFORMATION:   Selected notes from the MEDICAL RECORD NUMBER       CURRENT MEDICATIONS: Current Outpatient Medications (Ophthalmic Drugs)  Medication Sig  . Polyvinyl Alcohol-Povidone (REFRESH OP) Place 1 drop into both eyes daily.   No current facility-administered medications for this visit. (Ophthalmic Drugs)   Current Outpatient Medications (Other)  Medication Sig  . acetaminophen (TYLENOL) 500 MG tablet Take 1,000 mg by mouth every 6 (six) hours as needed for headache (pain).  Marland Kitchen amLODipine (NORVASC) 5 MG tablet Take 1 tablet (5 mg total) by mouth daily for 14 days.  . Calcium Carb-Cholecalciferol (CALCIUM 600-D PO) Take 600 mg by mouth 2 (two) times daily.  . Coenzyme Q10 (COQ10 PO) Take 1 capsule by mouth daily.  . diclofenac (VOLTAREN) 75 MG EC tablet Take 75 mg by mouth daily as needed (pain).  . fexofenadine (ALLEGRA) 180 MG tablet Take 180 mg by mouth daily.  . fluocinolone (SYNALAR) 0.01 % external  solution Apply 1 application topically See admin instructions. Apply topically to scalp once daily for two weeks, hold for two weeks and then repeat  . Glucosamine-Chondroitin (OSTEO BI-FLEX REGULAR STRENGTH PO) Take 1 tablet by mouth 2 (two) times daily.  Marland Kitchen lisinopril (PRINIVIL,ZESTRIL) 10 MG tablet Take 1 tablet (10 mg total) by mouth daily.  . vitamin E 400 UNIT capsule Take 400 Units by mouth daily.   No current facility-administered medications for this visit. (Other)      REVIEW OF SYSTEMS:    ALLERGIES Allergies  Allergen Reactions  . Codeine Nausea And Vomiting and Other (See Comments)    dizziness  . Tape Other (See Comments)    Burning sensation - pls use paper tape  . Ciprofloxacin Rash  . Sulfa Antibiotics Nausea And Vomiting and Rash    PAST MEDICAL HISTORY Past Medical History:  Diagnosis Date  . Hypertension   . Leaky heart valve    Past Surgical History:  Procedure Laterality Date  . ABDOMINAL HYSTERECTOMY    . BLADDER SURGERY    . CATARACT EXTRACTION    . LEG SURGERY Left   . ROTATOR CUFF REPAIR Right     FAMILY HISTORY History reviewed. No pertinent family history.  SOCIAL HISTORY Social History   Tobacco Use  . Smoking status: Never Smoker  . Smokeless tobacco: Never Used  Substance Use Topics  .  Alcohol use: Not Currently  . Drug use: Not Currently         OPHTHALMIC EXAM:  Base Eye Exam    Visual Acuity (Snellen - Linear)      Right Left   Dist cc 20/60 CF @ 3'   Dist ph cc 20/50 NI   Correction: Glasses       Tonometry (Tonopen, 1:23 PM)      Right Left   Pressure 12 16       Pupils      Pupils Dark Light Shape React APD   Right PERRL 4 4 Round Minimal None   Left PERRL 4 4 Round Minimal None       Visual Fields (Counting fingers)      Left Right    Full Full       Neuro/Psych    Oriented x3: Yes   Mood/Affect: Normal       Dilation    Both eyes: 1.0% Mydriacyl, 2.5% Phenylephrine @ 1:23 PM        Slit  Lamp and Fundus Exam    External Exam      Right Left   External Normal Normal       Slit Lamp Exam      Right Left   Lids/Lashes Normal Normal   Conjunctiva/Sclera White and quiet White and quiet   Cornea Clear Clear   Anterior Chamber Deep and quiet Deep and quiet   Iris Round and reactive Round and reactive   Lens Posterior chamber intraocular lens Posterior chamber intraocular lens   Anterior Vitreous Normal Normal       Fundus Exam      Right Left   Posterior Vitreous Posterior vitreous detachment Posterior vitreous detachment   Disc Normal Normal   C/D Ratio 0.35 0.4   Macula Hard drusen, Pigmented atrophy, Intermediate age related macular degeneration, no hemorrhage, no exudates, no membrane, Retinal pigment epithelial mottling Subretinal fibrosis with macular thickening,  Drusen, Pigmented atrophy, Advanced age related macular degeneration, Retinal pigment epithelial mottling,, no exudates, Disciform scar no hemorrhage   Vessels Normal Normal   Periphery Normal Normal          IMAGING AND PROCEDURES  Imaging and Procedures for 02/01/21  OCT, Retina - OU - Both Eyes       Right Eye Quality was good. Scan locations included subfoveal. Central Foveal Thickness: 279. Progression has been stable.   Left Eye Quality was good. Scan locations included subfoveal. Central Foveal Thickness: 298. Progression has improved. Findings include subretinal hyper-reflective material, subretinal scarring, disciform scar.   Notes Central disciform scar left eye continues to remain quiet at 9-week follow-up.  Overall much improved since onset of therapy       Intravitreal Injection, Pharmacologic Agent - OS - Left Eye       Time Out 02/01/2021. 2:30 PM. Confirmed correct patient, procedure, site, and patient consented.   Anesthesia Topical anesthesia was used. Anesthetic medications included Akten 3.5%.   Procedure Preparation included Tobramycin 0.3%, 10% betadine to  eyelids, 5% betadine to ocular surface. A 30 gauge needle was used.   Injection:  2.5 mg Bevacizumab (AVASTIN) 2.5mg /0.43mL SOSY   NDC: 21308-657-84, Lot: [id number]   Route: Intravitreal, Site: Left Eye  Post-op Post injection exam found visual acuity of at least counting fingers. The patient tolerated the procedure well. There were no complications. The patient received written and verbal post procedure care education. Post injection medications were not given.  ASSESSMENT/PLAN:  Exudative age-related macular degeneration of left eye with active choroidal neovascularization (HCC) Stable condition at 9-week follow-up.  Repeat injection today and extend interval examination to 11 weeks  Serous detachment of retinal pigment epithelium of left eye This condition part of the wet AMD left eye has improved on therapy  Intermediate stage nonexudative age-related macular degeneration of both eyes No signs on examination of CNVM formation OD or on OCT      ICD-10-CM   1. Exudative age-related macular degeneration of left eye with active choroidal neovascularization (HCC)  H35.3221 OCT, Retina - OU - Both Eyes    Intravitreal Injection, Pharmacologic Agent - OS - Left Eye    bevacizumab (AVASTIN) SOSY 2.5 mg  2. Serous detachment of retinal pigment epithelium of left eye  H35.722   3. Intermediate stage nonexudative age-related macular degeneration of both eyes  H35.3132     1.  We will repeat injection intravitreal Avastin OS today to prevent recurrence and prevent scotoma enlargement  2.  We will extend interval examination from 9 weeks today to 11 weeks next  3.  Ophthalmic Meds Ordered this visit:  Meds ordered this encounter  Medications  . bevacizumab (AVASTIN) SOSY 2.5 mg       Return in about 11 weeks (around 04/19/2021) for dilate, OS, AVASTIN OCT.  There are no Patient Instructions on file for this visit.   Explained the diagnoses, plan, and follow  up with the patient and they expressed understanding.  Patient expressed understanding of the importance of proper follow up care.   Clent Demark Emojean Gertz M.D. Diseases & Surgery of the Retina and Vitreous Retina & Diabetic [place] 02/01/21     Abbreviations: M myopia (nearsighted); A astigmatism; H hyperopia (farsighted); P presbyopia; Mrx spectacle prescription;  CTL contact lenses; OD right eye; OS left eye; OU both eyes  XT exotropia; ET esotropia; PEK punctate epithelial keratitis; PEE punctate epithelial erosions; DES dry eye syndrome; MGD meibomian gland dysfunction; ATs artificial tears; PFAT's preservative free artificial tears; [place] nuclear sclerotic cataract; PSC posterior subcapsular cataract; ERM epi-retinal membrane; PVD posterior vitreous detachment; RD retinal detachment; DM diabetes mellitus; DR diabetic retinopathy; NPDR non-proliferative diabetic retinopathy; PDR proliferative diabetic retinopathy; CSME clinically significant macular edema; DME diabetic macular edema; dbh dot blot hemorrhages; CWS cotton wool spot; POAG primary open angle glaucoma; C/D cup-to-disc ratio; HVF humphrey visual field; GVF goldmann visual field; OCT optical coherence tomography; IOP intraocular pressure; BRVO Branch retinal vein occlusion; CRVO central retinal vein occlusion; CRAO central retinal artery occlusion; BRAO branch retinal artery occlusion; RT retinal tear; SB scleral buckle; PPV pars plana vitrectomy; VH Vitreous hemorrhage; PRP panretinal laser photocoagulation; IVK intravitreal kenalog; VMT vitreomacular traction; MH Macular hole;  NVD neovascularization of the disc; NVE neovascularization elsewhere; AREDS age related eye disease study; ARMD age related macular degeneration; POAG primary open angle glaucoma; EBMD epithelial/anterior basement membrane dystrophy; ACIOL anterior chamber intraocular lens; IOL intraocular lens; PCIOL posterior chamber intraocular lens; Phaco/IOL phacoemulsification with  intraocular lens placement; [place] photorefractive keratectomy; LASIK laser assisted in situ keratomileusis; HTN hypertension; DM diabetes mellitus; COPD chronic obstructive pulmonary disease

## 2021-02-01 NOTE — Assessment & Plan Note (Signed)
No signs on examination of CNVM formation OD or on OCT

## 2021-02-01 NOTE — Assessment & Plan Note (Signed)
This condition part of the wet AMD left eye has improved on therapy

## 2021-04-20 ENCOUNTER — Encounter (INDEPENDENT_AMBULATORY_CARE_PROVIDER_SITE_OTHER): Payer: Self-pay | Admitting: Ophthalmology

## 2021-04-20 ENCOUNTER — Ambulatory Visit (INDEPENDENT_AMBULATORY_CARE_PROVIDER_SITE_OTHER): Payer: Medicare HMO | Admitting: Ophthalmology

## 2021-04-20 ENCOUNTER — Other Ambulatory Visit: Payer: Self-pay

## 2021-04-20 DIAGNOSIS — H353221 Exudative age-related macular degeneration, left eye, with active choroidal neovascularization: Secondary | ICD-10-CM | POA: Diagnosis not present

## 2021-04-20 DIAGNOSIS — H353132 Nonexudative age-related macular degeneration, bilateral, intermediate dry stage: Secondary | ICD-10-CM | POA: Diagnosis not present

## 2021-04-20 MED ORDER — BEVACIZUMAB 2.5 MG/0.1ML IZ SOSY
2.5000 mg | PREFILLED_SYRINGE | INTRAVITREAL | Status: AC | PRN
  Administered 2021-04-20: 2.5 mg via INTRAVITREAL

## 2021-04-20 NOTE — Progress Notes (Signed)
04/20/2021     CHIEF COMPLAINT Patient presents for Retina Follow Up (11 week fu OD and Avastin OS/Pt states VA OU stable since last visit. Pt denies FOL, floaters, or ocular pain OU. /)   HISTORY OF PRESENT ILLNESS: Raven Sampson is a 87 y.o. female who presents to the clinic today for:   HPI    Retina Follow Up    Diagnosis: Wet AMD   Laterality: left eye   Onset: 11 weeks ago   Severity: mild   Duration: 11 weeks   Course: stable   Comments: 11 week fu OD and Avastin OS Pt states VA OU stable since last visit. Pt denies FOL, floaters, or ocular pain OU.         Last edited by Kendra Opitz, COA on 04/20/2021  1:23 PM. (History)      Referring physician: Earnie Larsson, PA-C [address] [place],  [place] [address]  HISTORICAL INFORMATION:   Selected notes from the MEDICAL RECORD NUMBER       CURRENT MEDICATIONS: Current Outpatient Medications (Ophthalmic Drugs)  Medication Sig  . Polyvinyl Alcohol-Povidone (REFRESH OP) Place 1 drop into both eyes daily.   No current facility-administered medications for this visit. (Ophthalmic Drugs)   Current Outpatient Medications (Other)  Medication Sig  . acetaminophen (TYLENOL) 500 MG tablet Take 1,000 mg by mouth every 6 (six) hours as needed for headache (pain).  Marland Kitchen amLODipine (NORVASC) 5 MG tablet Take 1 tablet (5 mg total) by mouth daily for 14 days.  . Calcium Carb-Cholecalciferol (CALCIUM 600-D PO) Take 600 mg by mouth 2 (two) times daily.  . Coenzyme Q10 (COQ10 PO) Take 1 capsule by mouth daily.  . diclofenac (VOLTAREN) 75 MG EC tablet Take 75 mg by mouth daily as needed (pain).  . fexofenadine (ALLEGRA) 180 MG tablet Take 180 mg by mouth daily.  . fluocinolone (SYNALAR) 0.01 % external solution Apply 1 application topically See admin instructions. Apply topically to scalp once daily for two weeks, hold for two weeks and then repeat  . Glucosamine-Chondroitin (OSTEO BI-FLEX REGULAR STRENGTH PO) Take 1  tablet by mouth 2 (two) times daily.  Marland Kitchen lisinopril (PRINIVIL,ZESTRIL) 10 MG tablet Take 1 tablet (10 mg total) by mouth daily.  . vitamin E 400 UNIT capsule Take 400 Units by mouth daily.   No current facility-administered medications for this visit. (Other)      REVIEW OF SYSTEMS:    ALLERGIES Allergies  Allergen Reactions  . Codeine Nausea And Vomiting and Other (See Comments)    dizziness  . Tape Other (See Comments)    Burning sensation - pls use paper tape  . Ciprofloxacin Rash  . Sulfa Antibiotics Nausea And Vomiting and Rash    PAST MEDICAL HISTORY Past Medical History:  Diagnosis Date  . Hypertension   . Leaky heart valve    Past Surgical History:  Procedure Laterality Date  . ABDOMINAL HYSTERECTOMY    . BLADDER SURGERY    . CATARACT EXTRACTION    . LEG SURGERY Left   . ROTATOR CUFF REPAIR Right     FAMILY HISTORY History reviewed. No pertinent family history.  SOCIAL HISTORY Social History   Tobacco Use  . Smoking status: Never Smoker  . Smokeless tobacco: Never Used  Substance Use Topics  . Alcohol use: Not Currently  . Drug use: Not Currently         OPHTHALMIC EXAM: Base Eye Exam    Visual Acuity (  ETDRS)      Right Left   Dist cc 20/50 20/400   Dist ph cc NI NI   Correction: Glasses       Tonometry (Tonopen, 1:27 PM)      Right Left   Pressure 14 15       Pupils      Pupils Dark Light Shape React APD   Right PERRL 4 4 Round Minimal None   Left PERRL 4 4 Round Minimal None       Visual Fields (Counting fingers)      Left Right    Full Full       Extraocular Movement      Right Left    Full Full       Neuro/Psych    Oriented x3: Yes   Mood/Affect: Normal       Dilation    Left eye: 1.0% Mydriacyl, 2.5% Phenylephrine @ 1:27 PM        Slit Lamp and Fundus Exam    External Exam      Right Left   External Normal Normal       Slit Lamp Exam      Right Left   Lids/Lashes Normal Normal   Conjunctiva/Sclera  White and quiet White and quiet   Cornea Clear Clear   Anterior Chamber Deep and quiet Deep and quiet   Iris Round and reactive Round and reactive   Lens Posterior chamber intraocular lens Posterior chamber intraocular lens   Anterior Vitreous Normal Normal       Fundus Exam      Right Left   Posterior Vitreous  Posterior vitreous detachment   Disc  Normal   C/D Ratio 0.35 0.4   Macula  Macular thickening,  [place]us[place], Pigmented atrophy, Advanced age related macular degeneration, Retinal pigment epithelial mottling,, no exudates, Disciform scar no hemorrhage   Vessels  Normal   Periphery  Normal          IMAGING AND PROCEDURES  Imaging and Procedures for 04/20/21  OCT, Retina - OU - Both Eyes       Right Eye Quality was good. Scan locations included subfoveal. Central Foveal Thickness: 278. Progression has been stable.   Left Eye Quality was good. Scan locations included subfoveal. Central Foveal Thickness: 293. Progression has improved. Findings include subretinal hyper-reflective material, subretinal scarring, disciform scar.   Notes Central disciform scar left eye continues to remain quiet at 9-week follow-up.  Overall much improved since onset of therapy       Intravitreal Injection, Pharmacologic Agent - OS - Left Eye       Time Out 04/20/2021. 2:06 PM. Confirmed correct patient, procedure, site, and patient consented.   Anesthesia Topical anesthesia was used. Anesthetic medications included Akten 3.5%.   Procedure Preparation included Tobramycin 0.3%, 10% betadine to eyelids, 5% betadine to ocular surface. A 30 gauge needle was used.   Injection:  2.5 mg Bevacizumab (AVASTIN) 2.5mg /0.90mL SOSY   NDC: 41740-814-48, Lot: [id number]   Route: Intravitreal, Site: Left Eye  Post-op Post injection exam found visual acuity of at least counting fingers. The patient tolerated the procedure well. There were no complications. The patient received written and verbal post  procedure care education. Post injection medications were not given.                 ASSESSMENT/PLAN:  Intermediate stage nonexudative age-related macular degeneration of both eyes The nature of age--related macular degeneration was discussed with the patient as  well as the distinction between dry and wet types. Checking an Amsler Grid daily with advice to return immediately should a distortion develop, was given to the patient. The patient 's smoking status now and in the past was determined and advice based on the AREDS study was provided regarding the consumption of antioxidant supplements. AREDS 2 vitamin formulation was recommended. Consumption of dark leafy vegetables and fresh fruits of various colors was recommended. Treatment modalities for wet macular degeneration particularly the use of intravitreal injections of anti-blood vessel growth factors was discussed with the patient. Avastin, Lucentis, and Eylea are the available options. On occasion, therapy includes the use of photodynamic therapy and thermal laser. Stressed to the patient do not rub eyes.  Patient was advised to check Amsler Grid daily and return immediately if changes are noted. Instructions on using the grid were given to the patient. All patient questions were answered.      ICD-10-CM   1. Exudative age-related macular degeneration of left eye with active choroidal neovascularization (HCC)  H35.3221 OCT, Retina - OU - Both Eyes    Intravitreal Injection, Pharmacologic Agent - OS - Left Eye    bevacizumab (AVASTIN) SOSY 2.5 mg  2. Intermediate stage nonexudative age-related macular degeneration of both eyes  H35.3132     1.  OS, anatomically much improved since onset of therapy June 2021 for wet AMD.  Much less subretinal fluid remains.  Retinal architecture remains largely intact with the exception of loss of the photoreceptor layer which overlies subretinal hyper reflective material likely disciform fibrotic  scarring. Today at 11-week follow-up post most recent Avastin.  We will repeat injection today OS and extend interval examination next 13 weeks  2.  No signs of CNVM OD  3.  Dilate OU next  Ophthalmic Meds Ordered this visit:  Meds ordered this encounter  Medications  . bevacizumab (AVASTIN) SOSY 2.5 mg       Return in about 13 weeks (around 07/20/2021) for DILATE OU, AVASTIN OCT, OS.  There are no Patient Instructions on file for this visit.   Explained the diagnoses, plan, and follow up with the patient and they expressed understanding.  Patient expressed understanding of the importance of proper follow up care.   Clent Demark Natalynn Pedone M.D. Diseases & Surgery of the Retina and Vitreous Retina [facility] Diabetic [place] 04/20/21     Abbreviations: M myopia (nearsighted); A astigmatism; H hyperopia (farsighted); P presbyopia; Mrx spectacle prescription;  CTL contact lenses; OD right eye; OS left eye; OU both eyes  XT exotropia; ET esotropia; PEK punctate epithelial keratitis; PEE punctate epithelial erosions; DES dry eye syndrome; MGD meibomian gland dysfunction; ATs artificial tears; PFAT's preservative free artificial tears; [place] nuclear sclerotic cataract; PSC posterior subcapsular cataract; ERM epi-retinal membrane; PVD posterior vitreous detachment; RD retinal detachment; DM diabetes mellitus; DR diabetic retinopathy; NPDR non-proliferative diabetic retinopathy; PDR proliferative diabetic retinopathy; CSME clinically significant macular edema; DME diabetic macular edema; dbh dot blot hemorrhages; CWS cotton wool spot; POAG primary open angle glaucoma; C/D cup-to-disc ratio; HVF humphrey visual field; GVF goldmann visual field; OCT optical coherence tomography; IOP intraocular pressure; BRVO Branch retinal vein occlusion; CRVO central retinal vein occlusion; CRAO central retinal artery occlusion; BRAO branch retinal artery occlusion; RT retinal tear; SB scleral buckle; PPV pars plana vitrectomy;  VH Vitreous hemorrhage; PRP panretinal laser photocoagulation; IVK intravitreal kenalog; VMT vitreomacular traction; MH Macular hole;  NVD neovascularization of the disc; NVE neovascularization elsewhere; AREDS age related eye disease study; ARMD age related  macular degeneration; POAG primary open angle glaucoma; EBMD epithelial/anterior basement membrane dystrophy; ACIOL anterior chamber intraocular lens; IOL intraocular lens; PCIOL posterior chamber intraocular lens; Phaco/IOL phacoemulsification with intraocular lens placement; [place] photorefractive keratectomy; LASIK laser assisted in situ keratomileusis; HTN hypertension; DM diabetes mellitus; COPD chronic obstructive pulmonary disease

## 2021-04-20 NOTE — Assessment & Plan Note (Signed)

## 2021-07-20 ENCOUNTER — Ambulatory Visit (INDEPENDENT_AMBULATORY_CARE_PROVIDER_SITE_OTHER): Payer: Medicare HMO | Admitting: Ophthalmology

## 2021-07-20 ENCOUNTER — Encounter (INDEPENDENT_AMBULATORY_CARE_PROVIDER_SITE_OTHER): Payer: Self-pay | Admitting: Ophthalmology

## 2021-07-20 ENCOUNTER — Other Ambulatory Visit: Payer: Self-pay

## 2021-07-20 DIAGNOSIS — H353221 Exudative age-related macular degeneration, left eye, with active choroidal neovascularization: Secondary | ICD-10-CM

## 2021-07-20 DIAGNOSIS — H353211 Exudative age-related macular degeneration, right eye, with active choroidal neovascularization: Secondary | ICD-10-CM | POA: Diagnosis not present

## 2021-07-20 MED ORDER — BEVACIZUMAB 2.5 MG/0.1ML IZ SOSY
2.5000 mg | PREFILLED_SYRINGE | INTRAVITREAL | Status: AC | PRN
  Administered 2021-07-20: 2.5 mg via INTRAVITREAL

## 2021-07-20 NOTE — Progress Notes (Signed)
07/20/2021     CHIEF COMPLAINT Patient presents for  Chief Complaint  Patient presents with   Retina Follow Up      HISTORY OF PRESENT ILLNESS: Raven Sampson is a 87 y.o. female who presents to the clinic today for:   HPI     Retina Follow Up   Patient presents with  Wet AMD.  In left eye.  This started 13 weeks ago.  Severity is mild.  Duration of 13 weeks.  Since onset it is stable.        Comments   13 weeks fu OU and Avastin OS Pt states VA OU stable since last visit. Pt denies FOL, floaters, or ocular pain OU.        Last edited by Kendra Opitz, COA on 07/20/2021  1:06 PM.      Referring physician: Earnie Larsson, PA-C [id number] [place],  [place] [address]  HISTORICAL INFORMATION:   Selected notes from the MEDICAL RECORD NUMBER       CURRENT MEDICATIONS: Current Outpatient Medications (Ophthalmic Drugs)  Medication Sig   Polyvinyl Alcohol-Povidone (REFRESH OP) Place 1 drop into both eyes daily.   No current facility-administered medications for this visit. (Ophthalmic Drugs)   Current Outpatient Medications (Other)  Medication Sig   acetaminophen (TYLENOL) 500 MG tablet Take 1,000 mg by mouth every 6 (six) hours as needed for headache (pain).   amLODipine (NORVASC) 5 MG tablet Take 1 tablet (5 mg total) by mouth daily for 14 days.   Calcium Carb-Cholecalciferol (CALCIUM 600-D PO) Take 600 mg by mouth 2 (two) times daily.   Coenzyme Q10 (COQ10 PO) Take 1 capsule by mouth daily.   diclofenac (VOLTAREN) 75 MG EC tablet Take 75 mg by mouth daily as needed (pain).   fexofenadine (ALLEGRA) 180 MG tablet Take 180 mg by mouth daily.   fluocinolone (SYNALAR) 0.01 % external solution Apply 1 application topically See admin instructions. Apply topically to scalp once daily for two weeks, hold for two weeks and then repeat   Glucosamine-Chondroitin (OSTEO BI-FLEX REGULAR STRENGTH PO) Take 1 tablet by mouth 2 (two) times daily.   lisinopril  (PRINIVIL,ZESTRIL) 10 MG tablet Take 1 tablet (10 mg total) by mouth daily.   vitamin E 400 UNIT capsule Take 400 Units by mouth daily.   No current facility-administered medications for this visit. (Other)      REVIEW OF SYSTEMS:    ALLERGIES Allergies  Allergen Reactions   Codeine Nausea And Vomiting and Other (See Comments)    dizziness   Tape Other (See Comments)    Burning sensation - pls use paper tape   Ciprofloxacin Rash   Sulfa Antibiotics Nausea And Vomiting and Rash    PAST MEDICAL HISTORY Past Medical History:  Diagnosis Date   Hypertension    Leaky heart valve    Past Surgical History:  Procedure Laterality Date   ABDOMINAL HYSTERECTOMY     BLADDER SURGERY     CATARACT EXTRACTION     LEG SURGERY Left    ROTATOR CUFF REPAIR Right     FAMILY HISTORY History reviewed. No pertinent family history.  SOCIAL HISTORY Social History   Tobacco Use   Smoking status: Never   Smokeless tobacco: Never  Substance Use Topics   Alcohol use: Not Currently   Drug use: Not Currently         OPHTHALMIC EXAM:  Base Eye Exam     Visual Acuity (ETDRS)  Right Left   Dist cc 20/70 -2 20/400 ecc   Dist ph cc 20/60 -1 NI    Correction: Glasses         Tonometry (Tonopen, 1:09 PM)       Right Left   Pressure 15 16         Pupils       Pupils Dark Light Shape React APD   Right PERRL 4 4 Round Minimal None   Left PERRL 4 4 Round Minimal None         Visual Fields (Counting fingers)       Left Right    Full Full         Extraocular Movement       Right Left    Full Full         Neuro/Psych     Oriented x3: Yes   Mood/Affect: Normal         Dilation     Both eyes: 1.0% Mydriacyl, 2.5% Phenylephrine @ 1:09 PM           Slit Lamp and Fundus Exam     External Exam       Right Left   External Normal Normal         Slit Lamp Exam       Right Left   Lids/Lashes Normal Normal   Conjunctiva/Sclera White and  quiet White and quiet   Cornea Clear Clear   Anterior Chamber Deep and quiet Deep and quiet   Iris Round and reactive Round and reactive   Lens Posterior chamber intraocular lens Posterior chamber intraocular lens   Anterior Vitreous Normal Normal         Fundus Exam       Right Left   Posterior Vitreous Posterior vitreous detachment Posterior vitreous detachment   Disc Normal Normal   C/D Ratio 0.35 0.4   Macula Retinal atrophy, Retinal pigment epithelial atrophy - geographic, and macular thickening inferonasal to FAZ OD  Drusen, Pigmented atrophy, Advanced age related macular degeneration, Retinal pigment epithelial mottling,, no exudates, Disciform scar no hemorrhage, no macular thickening, no membrane   Vessels Normal Normal   Periphery Normal Normal            IMAGING AND PROCEDURES  Imaging and Procedures for 07/20/21  OCT, Retina - OU - Both Eyes       Right Eye Quality was good. Scan locations included subfoveal. Central Foveal Thickness: 288. Progression has been stable. Findings include subretinal hyper-reflective material, subretinal fluid.   Left Eye Quality was good. Scan locations included subfoveal. Central Foveal Thickness: 296. Progression has improved. Findings include subretinal hyper-reflective material, subretinal scarring, disciform scar.   Notes Central disciform scar left eye continues to remain quiet at 13-week follow-up.  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 1:23 PM. Confirmed correct patient, procedure, site, and patient consented.   Anesthesia Topical anesthesia was used. Anesthetic medications included Akten 3.5%.   Procedure Preparation included Tobramycin 0.3%, Ofloxacin , 5% betadine to ocular surface, 10% betadine to eyelids.  A 30 gauge needle was used.   Injection: 2.5 mg bevacizumab 2.5 MG/0.1ML   Route: Intravitreal, Site: Right Eye   NDC: [phone], Lot: [id number]   Post-op Post  injection exam found visual acuity of at least counting fingers. The patient tolerated the procedure well. There were no complications. The patient received written and verbal post procedure care education. Post injection medications were not given.              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  Forms of therapy reviewed include the use of Anti-VEGF medications injected painlessly into the eye, as well as other possible treatment modalities, including thermal laser therapy. Fellow eye involvement and risks were discussed with the patient. Upon the finding of wet age related macular degeneration, treatment will be offered. The treatment regimen is on a treat as needed basis with the intent to treat if necessary and extend interval of exams when possible. On average 1 out of 6 patients do not need lifetime therapy. However, the risk of recurrent disease is high for a lifetime.  Initially monthly, then periodic, examinations and evaluations will determine whether the next treatment is required on the day of the examination.  New CNVM OD, will need to commence with therapy today with Avastin.  Consent change from left eye to right eye and signed by Dr. Zadie Rhine  Exudative age-related macular degeneration of left eye with active choroidal neovascularization ([place]) No active CNVM today currently at left 13-week follow-up.  Maintenance therapy was planned.  In absence of CNVM will continue to observe OS and cancel today's injection  Findings right eye mandate treatment immediately     ICD-10-CM   1. Exudative age-related macular degeneration of right eye with active choroidal  neovascularization (HCC)  H35.3211 Intravitreal Injection, Pharmacologic Agent - OD - Right Eye    bevacizumab (AVASTIN) SOSY 2.5 mg    2. Exudative age-related macular degeneration of left eye with active choroidal neovascularization (HCC)  H35.3221 OCT, Retina - OU - Both Eyes    CANCELED: Intravitreal Injection, Pharmacologic Agent - OS - Left Eye      1.  OS with history of CNVM and extension of interval examination now to 13-week.  Lesion is quiescent with large region of geographic atrophy in the macula.  Thus injection today canceled  2.  OD with findings by clinical examination as well as on OCT of new onset CNVM inferonasal to [place].  This coincides with patient's noticing a subtle change in quality of vision in the right eye.  We will need to commence therapy today with intravitreal Avastin follow-up next OD in 6 weeks  3.  Ophthalmic Meds Ordered this visit:  Meds ordered this encounter  Medications   bevacizumab (AVASTIN) SOSY 2.5 mg       Return in about 6 weeks (around 08/31/2021) for dilate, OD, AVASTIN OCT.  There are no Patient Instructions on file for this visit.   Explained the diagnoses, plan, and follow up with the patient and they expressed understanding.  Patient expressed understanding of the importance of proper follow up care.   Clent Demark Treva Huyett M.D. Diseases & Surgery of the Retina and Vitreous Retina & Diabetic [place] 07/20/21     Abbreviations: M myopia (nearsighted); A astigmatism; H hyperopia (farsighted); P presbyopia; Mrx spectacle prescription;  CTL contact lenses; OD right eye; OS left eye; OU both eyes  XT exotropia; ET esotropia; PEK punctate epithelial keratitis; PEE punctate epithelial erosions; DES dry eye syndrome; MGD meibomian gland dysfunction; ATs artificial tears; PFAT's preservative free artificial tears; [place] nuclear  sclerotic cataract; PSC posterior subcapsular cataract; ERM epi-retinal membrane; PVD posterior vitreous  detachment; RD retinal detachment; DM diabetes mellitus; DR diabetic retinopathy; NPDR non-proliferative diabetic retinopathy; PDR proliferative diabetic retinopathy; CSME clinically significant macular edema; DME diabetic macular edema; dbh dot blot hemorrhages; CWS cotton wool spot; POAG primary open angle glaucoma; C/D cup-to-disc ratio; HVF humphrey visual field; GVF goldmann visual field; OCT optical coherence tomography; IOP intraocular pressure; BRVO Branch retinal vein occlusion; CRVO central retinal vein occlusion; CRAO central retinal artery occlusion; BRAO branch retinal artery occlusion; RT retinal tear; SB scleral buckle; PPV pars plana vitrectomy; VH Vitreous hemorrhage; PRP panretinal laser photocoagulation; IVK intravitreal kenalog; VMT vitreomacular traction; MH Macular hole;  NVD neovascularization of the disc; NVE neovascularization elsewhere; AREDS age related eye disease study; ARMD age related macular degeneration; POAG primary open angle glaucoma; EBMD epithelial/anterior basement membrane dystrophy; ACIOL anterior chamber intraocular lens; IOL intraocular lens; PCIOL posterior chamber intraocular lens; Phaco/IOL phacoemulsification with intraocular lens placement; [place] photorefractive keratectomy; LASIK laser assisted in situ keratomileusis; HTN hypertension; DM diabetes mellitus; COPD chronic obstructive pulmonary disease

## 2021-07-20 NOTE — Assessment & Plan Note (Signed)
The nature of wet macular degeneration was discussed with the patient.  Forms of therapy reviewed include the use of Anti-VEGF medications injected painlessly into the eye, as well as other possible treatment modalities, including thermal laser therapy. Fellow eye involvement and risks were discussed with the patient. Upon the finding of wet age related macular degeneration, treatment will be offered. The treatment regimen is on a treat as needed basis with the intent to treat if necessary and extend interval of exams when possible. On average 1 out of 6 patients do not need lifetime therapy. However, the risk of recurrent disease is high for a lifetime.  Initially monthly, then periodic, examinations and evaluations will determine whether the next treatment is required on the day of the examination.  New CNVM OD, will need to commence with therapy today with Avastin.  Consent change from left eye to right eye and signed by Dr. Zadie Rhine

## 2021-07-20 NOTE — Assessment & Plan Note (Signed)
No active CNVM today currently at left 13-week follow-up.  Maintenance therapy was planned.  In absence of CNVM will continue to observe OS and cancel today's injection  Findings right eye mandate treatment immediately

## 2021-08-31 ENCOUNTER — Encounter (INDEPENDENT_AMBULATORY_CARE_PROVIDER_SITE_OTHER): Payer: Medicare HMO | Admitting: Ophthalmology

## 2021-09-05 ENCOUNTER — Other Ambulatory Visit: Payer: Self-pay

## 2021-09-05 ENCOUNTER — Encounter (INDEPENDENT_AMBULATORY_CARE_PROVIDER_SITE_OTHER): Payer: Self-pay | Admitting: Ophthalmology

## 2021-09-05 ENCOUNTER — Ambulatory Visit (INDEPENDENT_AMBULATORY_CARE_PROVIDER_SITE_OTHER): Payer: Medicare HMO | Admitting: Ophthalmology

## 2021-09-05 DIAGNOSIS — H353221 Exudative age-related macular degeneration, left eye, with active choroidal neovascularization: Secondary | ICD-10-CM

## 2021-09-05 DIAGNOSIS — H353211 Exudative age-related macular degeneration, right eye, with active choroidal neovascularization: Secondary | ICD-10-CM

## 2021-09-05 DIAGNOSIS — H353222 Exudative age-related macular degeneration, left eye, with inactive choroidal neovascularization: Secondary | ICD-10-CM

## 2021-09-05 MED ORDER — BEVACIZUMAB 2.5 MG/0.1ML IZ SOSY
2.5000 mg | PREFILLED_SYRINGE | INTRAVITREAL | Status: AC | PRN
  Administered 2021-09-05: 2.5 mg via INTRAVITREAL

## 2021-09-05 NOTE — Assessment & Plan Note (Signed)
OD improved macular anatomy in visual acuity some 6 weeks and 5 days after first injection Avastin.  We will repeat injection today follow-up between 6 and 7 weeks again

## 2021-09-05 NOTE — Progress Notes (Signed)
09/05/2021     CHIEF COMPLAINT Patient presents for  Chief Complaint  Patient presents with   Retina Follow Up      HISTORY OF PRESENT ILLNESS: Raven Sampson is a 87 y.o. female who presents to the clinic today for:   HPI     Retina Follow Up   Patient presents with  Wet AMD.  In left eye.  This started 4 months ago.  Severity is mild.  Duration of 4 months.  Since onset it is stable.        Comments   60mo (6.5 weeks~after last exam for OD) fu OS oct avastin OS. Patient states vision is stable and unchanged since last visit. Denies any new floaters or FOL. Pt has a ciprofloxacin allergy.       Last edited by Hurman Horn, MD on 09/05/2021  3:20 PM.      Referring physician: Earnie Larsson, PA-C [address],  [place] [address]  HISTORICAL INFORMATION:   Selected notes from the MEDICAL RECORD NUMBER       CURRENT MEDICATIONS: Current Outpatient Medications (Ophthalmic Drugs)  Medication Sig   Polyvinyl Alcohol-Povidone (REFRESH OP) Place 1 drop into both eyes daily.   No current facility-administered medications for this visit. (Ophthalmic Drugs)   Current Outpatient Medications (Other)  Medication Sig   acetaminophen (TYLENOL) 500 MG tablet Take 1,000 mg by mouth every 6 (six) hours as needed for headache (pain).   amLODipine (NORVASC) 5 MG tablet Take 1 tablet (5 mg total) by mouth daily for 14 days.   Calcium Carb-Cholecalciferol (CALCIUM 600-D PO) Take 600 mg by mouth 2 (two) times daily.   Coenzyme Q10 (COQ10 PO) Take 1 capsule by mouth daily.   diclofenac (VOLTAREN) 75 MG EC tablet Take 75 mg by mouth daily as needed (pain).   fexofenadine (ALLEGRA) 180 MG tablet Take 180 mg by mouth daily.   fluocinolone (SYNALAR) 0.01 % external solution Apply 1 application topically See admin instructions. Apply topically to scalp once daily for two weeks, hold for two weeks and then repeat   Glucosamine-Chondroitin (OSTEO BI-FLEX REGULAR  STRENGTH PO) Take 1 tablet by mouth 2 (two) times daily.   lisinopril (PRINIVIL,ZESTRIL) 10 MG tablet Take 1 tablet (10 mg total) by mouth daily.   vitamin E 400 UNIT capsule Take 400 Units by mouth daily.   No current facility-administered medications for this visit. (Other)      REVIEW OF SYSTEMS:    ALLERGIES Allergies  Allergen Reactions   Codeine Nausea And Vomiting and Other (See Comments)    dizziness   Tape Other (See Comments)    Burning sensation - pls use paper tape   Ciprofloxacin Rash   Sulfa Antibiotics Nausea And Vomiting and Rash    PAST MEDICAL HISTORY Past Medical History:  Diagnosis Date   Hypertension    Leaky heart valve    Past Surgical History:  Procedure Laterality Date   ABDOMINAL HYSTERECTOMY     BLADDER SURGERY     CATARACT EXTRACTION     LEG SURGERY Left    ROTATOR CUFF REPAIR Right     FAMILY HISTORY History reviewed. No pertinent family history.  SOCIAL HISTORY Social History   Tobacco Use   Smoking status: Never   Smokeless tobacco: Never  Substance Use Topics   Alcohol use: Not Currently   Drug use: Not Currently         OPHTHALMIC EXAM:  Base Eye  Exam     Visual Acuity (ETDRS)       Right Left   Dist cc 20/50 -2 CF at 2'   Dist ph cc NI NI    Correction: Glasses         Tonometry (Tonopen, 2:43 PM)       Right Left   Pressure 12 15         Pupils       Pupils Dark Light Shape React APD   Right PERRL 4 3 Round Brisk None   Left PERRL 4 3 Round Brisk None         Extraocular Movement       Right Left    Full Full         Neuro/Psych     Oriented x3: Yes   Mood/Affect: Normal         Dilation     Left eye: 1.0% Mydriacyl, 2.5% Phenylephrine @ 2:43 PM           Slit Lamp and Fundus Exam     External Exam       Right Left   External Normal Normal         Slit Lamp Exam       Right Left   Lids/Lashes Normal Normal   Conjunctiva/Sclera White and quiet White and  quiet   Cornea Clear Clear   Anterior Chamber Deep and quiet Deep and quiet   Iris Round and reactive Round and reactive   Lens Posterior chamber intraocular lens Posterior chamber intraocular lens   Anterior Vitreous Normal Normal         Fundus Exam       Right Left   Posterior Vitreous  Posterior vitreous detachment   Disc  Normal   C/D Ratio  0.4   Macula   Drusen, Pigmented atrophy, Advanced age related macular degeneration, Retinal pigment epithelial mottling,, no exudates, Disciform scar no hemorrhage, no macular thickening, no membrane   Vessels  Normal   Periphery  Normal            IMAGING AND PROCEDURES  Imaging and Procedures for 09/05/21  OCT, Retina - OU - Both Eyes       Right Eye Quality was good. Scan locations included subfoveal. Central Foveal Thickness: 279. Progression has been stable. Findings include subretinal hyper-reflective material, subretinal fluid.   Left Eye Quality was good. Scan locations included subfoveal. Central Foveal Thickness: 312. Progression has improved. Findings include subretinal hyper-reflective material, subretinal scarring, disciform scar.   Notes Central disciform scar left eye continues to remain quiet at 19-week follow-up.  We will observe OS today  OD with new onset CNVM Findings inferonasal  improved post Avastin No. 1.  Currently at 6 weeks and 5 days post injection we will repeat injection today     Intravitreal Injection, Pharmacologic Agent - OD - Right Eye       Time Out 09/05/2021. 3:26 PM. Confirmed correct patient, procedure, site, and patient consented.   Anesthesia Topical anesthesia was used. Anesthetic medications included Lidocaine 4%.   Procedure Preparation included Tobramycin 0.3%, Ofloxacin , 5% betadine to ocular surface, 10% betadine to eyelids. A 30 gauge needle was used.   Injection: 2.5 mg bevacizumab 2.5 MG/0.1ML   Route: Intravitreal, Site: Right Eye   NDC: [phone], Lot:  [id number]   Post-op Post injection exam found visual acuity of at least counting fingers. The patient tolerated the procedure well. There were no complications. The  patient received written and verbal post procedure care education. Post injection medications included gentamicin.              ASSESSMENT/PLAN:  Exudative age-related macular degeneration of right eye with active choroidal neovascularization (HCC) OD improved macular anatomy in visual acuity some 6 weeks and 5 days after first injection Avastin.  We will repeat injection today follow-up between 6 and 7 weeks again  Exudative age-related macular degeneration of left eye with inactive choroidal neovascularization ([place]) OS now at some 1819 weeks post last injection still an active disease with diffuse macular atrophy with low risk of developing active CNVM OS again     ICD-10-CM   1. Exudative age-related macular degeneration of right eye with active choroidal neovascularization (HCC)  H35.3211 Intravitreal Injection, Pharmacologic Agent - OD - Right Eye    bevacizumab (AVASTIN) SOSY 2.5 mg    2. Exudative age-related macular degeneration of left eye with active choroidal neovascularization (HCC)  H35.3221 OCT, Retina - OU - Both Eyes    CANCELED: Intravitreal Injection, Pharmacologic Agent - OS - Left Eye    3. Exudative age-related macular degeneration of left eye with inactive choroidal neovascularization ([place])  H35.3222       1.  OD, improved macular anatomy and still acuity as well postinjection #1 Avastin for new recent onset CNVM.  We will repeat injection today and examination next in 6 weeks or so  2.  OS diffuse macular atrophy no active CNVM by exam or on OCT will observe again OS today  3.  Ophthalmic Meds Ordered this visit:  Meds ordered this encounter  Medications   bevacizumab (AVASTIN) SOSY 2.5 mg       Return in about 6 weeks (around 10/17/2021) for dilate, OD, AVASTIN OCT.  There are no  Patient Instructions on file for this visit.   Explained the diagnoses, plan, and follow up with the patient and they expressed understanding.  Patient expressed understanding of the importance of proper follow up care.   Clent Demark Kidus Delman M.D. Diseases & Surgery of the Retina and Vitreous Retina & Diabetic [place] 09/05/21     Abbreviations: M myopia (nearsighted); A astigmatism; H hyperopia (farsighted); P presbyopia; Mrx spectacle prescription;  CTL contact lenses; OD right eye; OS left eye; OU both eyes  XT exotropia; ET esotropia; PEK punctate epithelial keratitis; PEE punctate epithelial erosions; DES dry eye syndrome; MGD meibomian gland dysfunction; ATs artificial tears; PFAT's preservative free artificial tears; [place] nuclear sclerotic cataract; PSC posterior subcapsular cataract; ERM epi-retinal membrane; PVD posterior vitreous detachment; RD retinal detachment; DM diabetes mellitus; DR diabetic retinopathy; NPDR non-proliferative diabetic retinopathy; PDR proliferative diabetic retinopathy; CSME clinically significant macular edema; DME diabetic macular edema; dbh dot blot hemorrhages; CWS cotton wool spot; POAG primary open angle glaucoma; C/D cup-to-disc ratio; HVF humphrey visual field; GVF goldmann visual field; OCT optical coherence tomography; IOP intraocular pressure; BRVO Branch retinal vein occlusion; CRVO central retinal vein occlusion; CRAO central retinal artery occlusion; BRAO branch retinal artery occlusion; RT retinal tear; SB scleral buckle; PPV pars plana vitrectomy; VH Vitreous hemorrhage; PRP panretinal laser photocoagulation; IVK intravitreal kenalog; VMT vitreomacular traction; MH Macular hole;  NVD neovascularization of the disc; NVE neovascularization elsewhere; AREDS age related eye disease study; ARMD age related macular degeneration; POAG primary open angle glaucoma; EBMD epithelial/anterior basement membrane dystrophy; ACIOL anterior chamber intraocular lens; IOL  intraocular lens; PCIOL posterior chamber intraocular lens; Phaco/IOL phacoemulsification with intraocular lens placement; PRK photorefractive keratectomy; LASIK laser assisted in situ  keratomileusis; HTN hypertension; DM diabetes mellitus; COPD chronic obstructive pulmonary disease

## 2021-09-05 NOTE — Assessment & Plan Note (Signed)
OS now at some 1819 weeks post last injection still an active disease with diffuse macular atrophy with low risk of developing active CNVM OS again

## 2021-10-17 ENCOUNTER — Other Ambulatory Visit: Payer: Self-pay

## 2021-10-17 ENCOUNTER — Ambulatory Visit (INDEPENDENT_AMBULATORY_CARE_PROVIDER_SITE_OTHER): Payer: Medicare HMO | Admitting: Ophthalmology

## 2021-10-17 DIAGNOSIS — H353221 Exudative age-related macular degeneration, left eye, with active choroidal neovascularization: Secondary | ICD-10-CM | POA: Diagnosis not present

## 2021-10-17 DIAGNOSIS — H353211 Exudative age-related macular degeneration, right eye, with active choroidal neovascularization: Secondary | ICD-10-CM

## 2021-10-17 DIAGNOSIS — H353222 Exudative age-related macular degeneration, left eye, with inactive choroidal neovascularization: Secondary | ICD-10-CM | POA: Diagnosis not present

## 2021-10-17 MED ORDER — BEVACIZUMAB 2.5 MG/0.1ML IZ SOSY
2.5000 mg | PREFILLED_SYRINGE | INTRAVITREAL | Status: AC | PRN
  Administered 2021-10-17: 2.5 mg via INTRAVITREAL

## 2021-10-17 NOTE — Assessment & Plan Note (Addendum)
The nature of wet macular degeneration was discussed with the patient.  Forms of therapy reviewed include the use of Anti-VEGF medications injected painlessly into the eye, as well as other possible treatment modalities, including thermal laser therapy. Fellow eye involvement and risks were discussed with the patient. Upon the finding of wet age related macular degeneration, treatment will be offered. The treatment regimen is on a treat as needed basis with the intent to treat if necessary and extend interval of exams when possible. On average 1 out of 6 patients do not need lifetime therapy. However, the risk of recurrent disease is high for a lifetime.  Initially monthly, then periodic, examinations and evaluations will determine whether the next treatment is required on the day of the examination.  Today at 6-week follow-up post injection for CNVM improved OD post injection #2 we will repeat injection to preserve acuity and best I

## 2021-10-17 NOTE — Assessment & Plan Note (Addendum)
OS currently some 5 months since last injection, no active disease OS

## 2021-10-17 NOTE — Progress Notes (Signed)
10/17/2021     CHIEF COMPLAINT Patient presents for  Chief Complaint  Patient presents with   Retina Evaluation       HISTORY OF PRESENT ILLNESS: Raven Sampson is a 87 y.o. female who presents to the clinic today for:   HPI     Retina Evaluation   In both eyes.        Comments   History of bilateral ARMD, with quiescent CNVM OS now for 4 to 5 months and OD with recent onset of CNVM inferonasal to [facility] September 2022 , 6 weeks previous status post injection Avastin No. 2      Last edited by Hurman Horn, MD on 10/17/2021  2:43 PM.      Referring physician: Earnie Larsson, PA-C [address],  [place] [address]  HISTORICAL INFORMATION:   Selected notes from the MEDICAL RECORD NUMBER       CURRENT MEDICATIONS: Current Outpatient Medications (Ophthalmic Drugs)  Medication Sig   Polyvinyl Alcohol-Povidone (REFRESH OP) Place 1 drop into both eyes daily.   No current facility-administered medications for this visit. (Ophthalmic Drugs)   Current Outpatient Medications (Other)  Medication Sig   acetaminophen (TYLENOL) 500 MG tablet Take 1,000 mg by mouth every 6 (six) hours as needed for headache (pain).   amLODipine (NORVASC) 5 MG tablet Take 1 tablet (5 mg total) by mouth daily for 14 days.   Calcium Carb-Cholecalciferol (CALCIUM 600-D PO) Take 600 mg by mouth 2 (two) times daily.   Coenzyme Q10 (COQ10 PO) Take 1 capsule by mouth daily.   diclofenac (VOLTAREN) 75 MG EC tablet Take 75 mg by mouth daily as needed (pain).   fexofenadine (ALLEGRA) 180 MG tablet Take 180 mg by mouth daily.   fluocinolone (SYNALAR) 0.01 % external solution Apply 1 application topically See admin instructions. Apply topically to scalp once daily for two weeks, hold for two weeks and then repeat   Glucosamine-Chondroitin (OSTEO BI-FLEX REGULAR STRENGTH PO) Take 1 tablet by mouth 2 (two) times daily.   lisinopril (PRINIVIL,ZESTRIL) 10 MG tablet Take 1 tablet (10 mg  total) by mouth daily.   vitamin E 400 UNIT capsule Take 400 Units by mouth daily.   No current facility-administered medications for this visit. (Other)      REVIEW OF SYSTEMS:    ALLERGIES Allergies  Allergen Reactions   Codeine Nausea And Vomiting and Other (See Comments)    dizziness   Tape Other (See Comments)    Burning sensation - pls use paper tape   Ciprofloxacin Rash   Sulfa Antibiotics Nausea And Vomiting and Rash    PAST MEDICAL HISTORY Past Medical History:  Diagnosis Date   Hypertension    Leaky heart valve    Past Surgical History:  Procedure Laterality Date   ABDOMINAL HYSTERECTOMY     BLADDER SURGERY     CATARACT EXTRACTION     LEG SURGERY Left    ROTATOR CUFF REPAIR Right     FAMILY HISTORY No family history on file.  SOCIAL HISTORY Social History   Tobacco Use   Smoking status: Never   Smokeless tobacco: Never  Substance Use Topics   Alcohol use: Not Currently   Drug use: Not Currently         OPHTHALMIC EXAM:  Base Eye Exam     Tonometry (Tonopen, 2:19 PM)       Right Left   Pressure 17 17  Pupils       Pupils React   Right PERRL Slow   Left PERRL Slow         Visual Fields       Left Right     Full   Restrictions Partial inner superior temporal, inferior temporal, superior nasal, inferior nasal deficiencies          Extraocular Movement       Right Left    Full, Ortho Full, Ortho         Neuro/Psych     Oriented x3: Yes   Mood/Affect: Normal         Dilation     Both eyes: 1.0% Mydriacyl, 2.5% Phenylephrine @ 2:19 PM           Slit Lamp and Fundus Exam     External Exam       Right Left   External Normal Normal         Slit Lamp Exam       Right Left   Lids/Lashes Normal Normal   Conjunctiva/Sclera White and quiet White and quiet   Cornea Clear Clear   Anterior Chamber Deep and quiet Deep and quiet   Iris Round and reactive Round and reactive   Lens Posterior  chamber intraocular lens Posterior chamber intraocular lens   Anterior Vitreous Normal Normal         Fundus Exam       Right Left   Posterior Vitreous Posterior vitreous detachment Posterior vitreous detachment   Disc Normal Normal   C/D Ratio 0.35 0.4   Macula Retinal atrophy, Retinal pigment epithelial atrophy - geographic, and macular thickening inferonasal to FAZ OD Drusen, Pigmented atrophy, Advanced age related macular degeneration, Retinal pigment epithelial mottling, no exudates, Disciform scar no hemorrhage, no macular thickening, no membrane   Vessels Normal Normal   Periphery Normal Normal            IMAGING AND PROCEDURES  Imaging and Procedures for 10/17/21  OCT, Retina - OU - Both Eyes       Right Eye Quality was good. Scan locations included subfoveal. Central Foveal Thickness: 287. Progression has been stable. Findings include subretinal hyper-reflective material, subretinal fluid.   Left Eye Quality was good. Scan locations included subfoveal. Central Foveal Thickness: 345. Progression has improved. Findings include subretinal hyper-reflective material, subretinal scarring, disciform scar.   Notes Central disciform scar left eye continues to remain quiet at 19-week follow-up.  We will observe OS today  OD with new onset CNVM Findings inferonasal  improved post Avastin No. 2.  Currently at 6 weeks  post injection we will repeat injection today, vastly improved as compared to onset September 2022     Intravitreal Injection, Pharmacologic Agent - OD - Right Eye       Time Out 10/17/2021. 2:40 PM. Confirmed correct patient, procedure, site, and patient consented.   Anesthesia Topical anesthesia was used. Anesthetic medications included Lidocaine 4%.   Procedure Preparation included Tobramycin 0.3%, Ofloxacin , 5% betadine to ocular surface, 10% betadine to eyelids. A 30 gauge needle was used.   Injection: 2.5 mg bevacizumab 2.5 MG/0.1ML   Route:  Intravitreal, Site: Right Eye   NDC: [phone], Lot: [id number]   Post-op Post injection exam found visual acuity of at least counting fingers. The patient tolerated the procedure well. There were no complications. The patient received written and verbal post procedure care education. Post injection medications included gentamicin.  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  Forms of therapy reviewed include the use of Anti-VEGF medications injected painlessly into the eye, as well as other possible treatment modalities, including thermal laser therapy. Fellow eye involvement and risks were discussed with the patient. Upon the finding of wet age related macular degeneration, treatment will be offered. The treatment regimen is on a treat as needed basis with the intent to treat if necessary and extend interval of exams when possible. On average 1 out of 6 patients do not need lifetime therapy. However, the risk of recurrent disease is high for a lifetime.  Initially monthly, then periodic, examinations and evaluations will determine whether the next treatment is required on the day of the examination.  Today at 6-week follow-up post injection for CNVM improved OD post injection #2 we will repeat injection to preserve acuity and best I   Exudative age-related macular degeneration of left eye with inactive choroidal neovascularization (HCC) OS currently some 5 months since last injection, no active disease OS     ICD-10-CM   1. Exudative age-related macular degeneration of right eye with active choroidal neovascularization (HCC)  H35.3211 OCT, Retina - OU - Both Eyes    Intravitreal Injection, Pharmacologic Agent - OD - Right Eye    bevacizumab (AVASTIN) SOSY 2.5 mg    2. Exudative age-related macular degeneration of left eye with  active choroidal neovascularization (HCC)  H35.3221 OCT, Retina - OU - Both Eyes    3. Exudative age-related macular degeneration of left eye with inactive choroidal neovascularization ([place])  H35.3222       1.  OD with active CNVM as recent as onset September 2022, vastly improved today at 6-week interval repeat injection Avastin OD today and examination next in 8 weeks  2.  OS still stable, no active CNVM with subfoveal scarring accounting for acuity currently 5 months post most recent injection will observe OS  3.  Ophthalmic Meds Ordered this visit:  Meds ordered this encounter  Medications   bevacizumab (AVASTIN) SOSY 2.5 mg       Return in about 8 weeks (around 12/12/2021) for dilate, OD, AVASTIN OCT.  There are no Patient Instructions on file for this visit.   Explained the diagnoses, plan, and follow up with the patient and they expressed understanding.  Patient expressed understanding of the importance of proper follow up care.   Clent Demark Lorry Anastasi M.D. Diseases & Surgery of the Retina and Vitreous Retina & Diabetic [place] 10/17/21     Abbreviations: M myopia (nearsighted); A astigmatism; H hyperopia (farsighted); P presbyopia; Mrx spectacle prescription;  CTL contact lenses; OD right eye; OS left eye; OU both eyes  XT exotropia; ET esotropia; PEK punctate epithelial keratitis; PEE punctate epithelial erosions; DES dry eye syndrome; MGD meibomian gland dysfunction; ATs artificial tears; PFAT's preservative free artificial tears; [place] nuclear sclerotic cataract; PSC posterior subcapsular cataract; ERM epi-retinal membrane; PVD posterior vitreous detachment; RD retinal detachment; DM diabetes mellitus; DR diabetic retinopathy; NPDR non-proliferative diabetic retinopathy; PDR proliferative diabetic retinopathy; CSME clinically significant macular edema; DME diabetic macular edema; dbh dot blot hemorrhages; CWS cotton wool spot; POAG primary open angle glaucoma; C/D cup-to-disc  ratio; HVF humphrey visual field; GVF goldmann visual field; OCT optical coherence tomography; IOP intraocular pressure; BRVO Branch retinal vein occlusion; CRVO central retinal vein occlusion; CRAO central retinal artery occlusion; BRAO branch retinal artery occlusion; RT  retinal tear; SB scleral buckle; PPV pars plana vitrectomy; VH Vitreous hemorrhage; PRP panretinal laser photocoagulation; IVK intravitreal kenalog; VMT vitreomacular traction; MH Macular hole;  NVD neovascularization of the disc; NVE neovascularization elsewhere; AREDS age related eye disease study; ARMD age related macular degeneration; POAG primary open angle glaucoma; EBMD epithelial/anterior basement membrane dystrophy; ACIOL anterior chamber intraocular lens; IOL intraocular lens; PCIOL posterior chamber intraocular lens; Phaco/IOL phacoemulsification with intraocular lens placement; PRK photorefractive keratectomy; LASIK laser assisted in situ keratomileusis; HTN hypertension; DM diabetes mellitus; COPD chronic obstructive pulmonary disease  

## 2021-12-12 ENCOUNTER — Ambulatory Visit (INDEPENDENT_AMBULATORY_CARE_PROVIDER_SITE_OTHER): Payer: Medicare HMO | Admitting: Ophthalmology

## 2021-12-12 ENCOUNTER — Encounter (INDEPENDENT_AMBULATORY_CARE_PROVIDER_SITE_OTHER): Payer: Self-pay | Admitting: Ophthalmology

## 2021-12-12 ENCOUNTER — Other Ambulatory Visit: Payer: Self-pay

## 2021-12-12 DIAGNOSIS — H353211 Exudative age-related macular degeneration, right eye, with active choroidal neovascularization: Secondary | ICD-10-CM

## 2021-12-12 DIAGNOSIS — H353222 Exudative age-related macular degeneration, left eye, with inactive choroidal neovascularization: Secondary | ICD-10-CM

## 2021-12-12 DIAGNOSIS — H353132 Nonexudative age-related macular degeneration, bilateral, intermediate dry stage: Secondary | ICD-10-CM | POA: Diagnosis not present

## 2021-12-12 MED ORDER — BEVACIZUMAB 2.5 MG/0.1ML IZ SOSY
2.5000 mg | PREFILLED_SYRINGE | INTRAVITREAL | Status: AC | PRN
  Administered 2021-12-12: 15:00:00 2.5 mg via INTRAVITREAL

## 2021-12-12 NOTE — Assessment & Plan Note (Signed)
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

## 2021-12-12 NOTE — Assessment & Plan Note (Signed)
Now some 4 months on therapy for new onset wet AMD vastly improved macular findings.  At 8-week interval today post Avastin.  Follow-up again in 9 weeks after injection today

## 2021-12-12 NOTE — Progress Notes (Signed)
12/12/2021     CHIEF COMPLAINT Patient presents for  Chief Complaint  Patient presents with   Retina Follow Up      HISTORY OF PRESENT ILLNESS: Raven Sampson is a 88 y.o. 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  No interval change in her vision over the last 8 weeks since last examination and treatment OD  OS to some 7 months off therapy HPI     Retina Follow Up           Diagnosis: Wet AMD   Laterality: right eye   Onset: 8 weeks ago   Severity: mild   Duration: 8 weeks   Course: stable         Comments   8 weeks dilate OD, Avastin OCT. Pt allergy to ciprofloxacin. Patient states vision is stable and unchanged since last visit. Denies any new floaters or FOL. Pt states she has noticed a pain shooting through her right eye, started about 3 days ago.       Last edited by Laurin Coder on 12/12/2021  2:14 PM.      Referring physician: Earnie Larsson, PA-C [address],  [place] [address]  HISTORICAL INFORMATION:   Selected notes from the MEDICAL RECORD NUMBER       CURRENT MEDICATIONS: Current Outpatient Medications (Ophthalmic Drugs)  Medication Sig   Polyvinyl Alcohol-Povidone (REFRESH OP) Place 1 drop into both eyes daily.   No current facility-administered medications for this visit. (Ophthalmic Drugs)   Current Outpatient Medications (Other)  Medication Sig   acetaminophen (TYLENOL) 500 MG tablet Take 1,000 mg by mouth every 6 (six) hours as needed for headache (pain).   amLODipine (NORVASC) 5 MG tablet Take 1 tablet (5 mg total) by mouth daily for 14 days.   Calcium Carb-Cholecalciferol (CALCIUM 600-D PO) Take 600 mg by mouth 2 (two) times daily.   Coenzyme Q10 (COQ10 PO) Take 1 capsule by mouth daily.   diclofenac (VOLTAREN) 75 MG EC tablet Take 75 mg by mouth daily as needed (pain).   fexofenadine (ALLEGRA) 180 MG tablet Take 180  mg by mouth daily.   fluocinolone (SYNALAR) 0.01 % external solution Apply 1 application topically See admin instructions. Apply topically to scalp once daily for two weeks, hold for two weeks and then repeat   Glucosamine-Chondroitin (OSTEO BI-FLEX REGULAR STRENGTH PO) Take 1 tablet by mouth 2 (two) times daily.   lisinopril (PRINIVIL,ZESTRIL) 10 MG tablet Take 1 tablet (10 mg total) by mouth daily.   vitamin E 400 UNIT capsule Take 400 Units by mouth daily.   No current facility-administered medications for this visit. (Other)      REVIEW OF SYSTEMS:    ALLERGIES Allergies  Allergen Reactions   Codeine Nausea And Vomiting and Other (See Comments)    dizziness   Tape Other (See Comments)    Burning sensation - pls use paper tape   Ciprofloxacin Rash   Sulfa Antibiotics Nausea And Vomiting and Rash    PAST MEDICAL HISTORY Past Medical History:  Diagnosis Date   Hypertension    Leaky heart valve    Past Surgical History:  Procedure Laterality Date   ABDOMINAL HYSTERECTOMY     BLADDER SURGERY     CATARACT EXTRACTION     LEG SURGERY Left    ROTATOR CUFF REPAIR Right     FAMILY HISTORY History  reviewed. No pertinent family history.  SOCIAL HISTORY Social History   Tobacco Use   Smoking status: Never   Smokeless tobacco: Never  Substance Use Topics   Alcohol use: Not Currently   Drug use: Not Currently         OPHTHALMIC EXAM:  Base Eye Exam     Visual Acuity (ETDRS)       Right Left   Dist cc 20/50 -2 CF at 1'   Dist ph cc NI NI    Correction: Glasses         Tonometry (Tonopen, 2:18 PM)       Right Left   Pressure 13 12         Pupils       Pupils Dark Light APD   Right PERRL 4 3 None   Left PERRL 4 3 None         Extraocular Movement       Right Left    Full Full         Neuro/Psych     Oriented x3: Yes   Mood/Affect: Normal         Dilation     Right eye: 1.0% Mydriacyl, 2.5% Phenylephrine @ 2:18 PM            Slit Lamp and Fundus Exam     External Exam       Right Left   External Normal Normal         Slit Lamp Exam       Right Left   Lids/Lashes Normal Normal   Conjunctiva/Sclera White and quiet White and quiet   Cornea Clear Clear   Anterior Chamber Deep and quiet Deep and quiet   Iris Round and reactive Round and reactive   Lens Posterior chamber intraocular lens Posterior chamber intraocular lens   Anterior Vitreous Normal Normal         Fundus Exam       Right Left   Posterior Vitreous Posterior vitreous detachment    Disc Normal    C/D Ratio 0.35    Macula Retinal atrophy, Retinal pigment epithelial atrophy - geographic, and macular thickening inferonasal to FAZ OD    Vessels Normal    Periphery Normal             IMAGING AND PROCEDURES  Imaging and Procedures for 12/12/21  Intravitreal Injection, Pharmacologic Agent - OD - Right Eye       Time Out 12/12/2021. 2:54 PM. Confirmed correct patient, procedure, site, and patient consented.   Anesthesia Topical anesthesia was used. Anesthetic medications included Lidocaine 4%.   Procedure Preparation included Tobramycin 0.3%, Ofloxacin , 5% betadine to ocular surface, 10% betadine to eyelids. A 30 gauge needle was used.   Injection: 2.5 mg bevacizumab 2.5 MG/0.1ML   Route: Intravitreal, Site: Right Eye   NDC: [phone], Lot: [id number]   Post-op Post injection exam found visual acuity of at least counting fingers. The patient tolerated the procedure well. There were no complications. The patient received written and verbal post procedure care education. Post injection medications included gentamicin.      OCT, Retina - OU - Both Eyes       Right Eye Quality was good. Scan locations included subfoveal. Central Foveal Thickness: 285. Progression has been stable. Findings include subretinal hyper-reflective material, subretinal fluid.   Left Eye Quality was good. Scan locations included  subfoveal. Central Foveal Thickness: 387. Progression has improved. Findings include subretinal hyper-reflective material, subretinal  scarring, disciform scar.   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  At 8-week interval today post Avastin.  Follow-up again in 9 weeks after injection today     ICD-10-CM   1. Exudative age-related macular degeneration of right eye with active choroidal neovascularization (HCC)  H35.3211 Intravitreal Injection, Pharmacologic Agent - OD - Right Eye    OCT, Retina - OU - Both Eyes    bevacizumab (AVASTIN) SOSY 2.5 mg    2. Intermediate stage nonexudative age-related macular degeneration of both eyes  H35.3132     3. Exudative age-related macular degeneration of left eye with inactive choroidal neovascularization (HCC)  H35.3222       1.  2.  3.  Ophthalmic Meds Ordered this visit:  Meds ordered this encounter  Medications   bevacizumab (AVASTIN) SOSY 2.5 mg       Return in about 9 weeks (around 02/13/2022) for DILATE OU, AVASTIN OCT, OD.  There are no Patient Instructions on file for this visit.   Explained the diagnoses, plan, and follow up with the patient and they  expressed understanding.  Patient expressed understanding of the importance of proper follow up care.   Clent Demark Phuong Moffatt M.D. Diseases & Surgery of the Retina and Vitreous [facility] 12/12/21     Abbreviations: M myopia (nearsighted); A astigmatism; H hyperopia (farsighted); P presbyopia; Mrx spectacle prescription;  CTL contact lenses; OD right eye; OS left eye; OU both eyes  XT exotropia; ET esotropia; PEK punctate epithelial keratitis; PEE punctate epithelial erosions; DES dry eye syndrome; MGD meibomian gland dysfunction; ATs artificial tears; PFAT's preservative free artificial tears; [place] nuclear sclerotic cataract; PSC posterior subcapsular cataract; ERM epi-retinal membrane; PVD posterior vitreous detachment; RD retinal detachment; DM diabetes mellitus; DR diabetic retinopathy; NPDR non-proliferative diabetic retinopathy; PDR proliferative diabetic retinopathy; CSME clinically significant macular edema; DME diabetic macular edema; dbh dot blot hemorrhages; CWS cotton wool spot; POAG primary open angle glaucoma; C/D cup-to-disc ratio; HVF humphrey visual field; GVF goldmann visual field; OCT optical coherence tomography; IOP intraocular pressure; BRVO Branch retinal vein occlusion; CRVO central retinal vein occlusion; CRAO central retinal artery occlusion; BRAO branch retinal artery occlusion; RT retinal tear; SB scleral buckle; PPV pars plana vitrectomy; VH Vitreous hemorrhage; PRP panretinal laser photocoagulation; IVK intravitreal kenalog; VMT vitreomacular traction; MH Macular hole;  NVD neovascularization of the disc; NVE neovascularization elsewhere; AREDS age related eye disease study; ARMD age related macular degeneration; POAG primary open angle glaucoma; EBMD epithelial/anterior basement membrane dystrophy; ACIOL anterior chamber intraocular lens; IOL intraocular lens; PCIOL posterior chamber intraocular lens; Phaco/IOL phacoemulsification with intraocular lens placement;  [place] photorefractive keratectomy; LASIK laser assisted in situ keratomileusis; HTN hypertension; DM diabetes mellitus; COPD chronic obstructive pulmonary disease

## 2021-12-12 NOTE — Assessment & Plan Note (Signed)
Stable overall.  

## 2022-02-13 ENCOUNTER — Encounter (INDEPENDENT_AMBULATORY_CARE_PROVIDER_SITE_OTHER): Payer: Self-pay | Admitting: Ophthalmology

## 2022-02-13 ENCOUNTER — Other Ambulatory Visit: Payer: Self-pay

## 2022-02-13 ENCOUNTER — Ambulatory Visit (INDEPENDENT_AMBULATORY_CARE_PROVIDER_SITE_OTHER): Payer: Medicare HMO | Admitting: Ophthalmology

## 2022-02-13 DIAGNOSIS — H353222 Exudative age-related macular degeneration, left eye, with inactive choroidal neovascularization: Secondary | ICD-10-CM

## 2022-02-13 DIAGNOSIS — H353211 Exudative age-related macular degeneration, right eye, with active choroidal neovascularization: Secondary | ICD-10-CM

## 2022-02-13 MED ORDER — BEVACIZUMAB 2.5 MG/0.1ML IZ SOSY
2.5000 mg | PREFILLED_SYRINGE | INTRAVITREAL | Status: AC | PRN
  Administered 2022-02-13: 2.5 mg via INTRAVITREAL

## 2022-02-13 NOTE — Assessment & Plan Note (Signed)
OS stable no signs of recurrences ?

## 2022-02-13 NOTE — Progress Notes (Signed)
? ? ?02/13/2022 ? ?  ? ?CHIEF COMPLAINT ?Patient presents for  ?Chief Complaint  ?Patient presents with  ? Macular Degeneration  ? ?No interval change in symptoms ? ? ?HISTORY OF PRESENT ILLNESS: ?Raven Sampson is a 88 y.o. female who presents to the clinic today for:  ? ?HPI   ?9 weeks for dilate ou, avastin oct OD. ?Pt states, "I am seeing little black spots in my eye and I feel like I dont see as well." ?Pt sees floaters and FOL. ?PT states no changes in vision.  ? ?Last edited by Silvestre Moment on 02/13/2022  1:27 PM.  ?  ? ? ?Referring physician: ?Earnie Larsson, PA-C ?[facility] ?[address] ?[place],  [place] [address] ? ?HISTORICAL INFORMATION:  ? ?Selected notes from the [facility] ?  ?   ? ?CURRENT MEDICATIONS: ?Current Outpatient Medications (Ophthalmic Drugs)  ?Medication Sig  ? Polyvinyl Alcohol-Povidone (REFRESH OP) Place 1 drop into both eyes daily.  ? ?No current facility-administered medications for this visit. (Ophthalmic Drugs)  ? ?Current Outpatient Medications (Other)  ?Medication Sig  ? acetaminophen (TYLENOL) 500 MG tablet Take 1,000 mg by mouth every 6 (six) hours as needed for headache (pain).  ? amLODipine (NORVASC) 5 MG tablet Take 1 tablet (5 mg total) by mouth daily for 14 days.  ? Calcium Carb-Cholecalciferol (CALCIUM 600-D PO) Take 600 mg by mouth 2 (two) times daily.  ? Coenzyme Q10 (COQ10 PO) Take 1 capsule by mouth daily.  ? diclofenac (VOLTAREN) 75 MG EC tablet Take 75 mg by mouth daily as needed (pain).  ? fexofenadine (ALLEGRA) 180 MG tablet Take 180 mg by mouth daily.  ? fluocinolone (SYNALAR) 0.01 % external solution Apply 1 application topically See admin instructions. Apply topically to scalp once daily for two weeks, hold for two weeks and then repeat  ? Glucosamine-Chondroitin (OSTEO BI-FLEX REGULAR STRENGTH PO) Take 1 tablet by mouth 2 (two) times daily.  ? lisinopril (PRINIVIL,ZESTRIL) 10 MG tablet Take 1 tablet (10 mg total) by mouth daily.  ? vitamin E 400 UNIT capsule  Take 400 Units by mouth daily.  ? ?No current facility-administered medications for this visit. (Other)  ? ? ? ? ?REVIEW OF SYSTEMS: ?ROS   ?Negative for: Constitutional, Gastrointestinal, Neurological, Skin, Genitourinary, Musculoskeletal, HENT, Endocrine, Cardiovascular, Eyes, Respiratory, Psychiatric, Allergic/Imm, Heme/Lymph ?Last edited by Silvestre Moment on 02/13/2022  1:27 PM.  ?  ? ? ? ?ALLERGIES ?Allergies  ?Allergen Reactions  ? Codeine Nausea And Vomiting and Other (See Comments)  ?  dizziness  ? Tape Other (See Comments)  ?  Burning sensation - pls use paper tape  ? Ciprofloxacin Rash  ? Sulfa Antibiotics Nausea And Vomiting and Rash  ? ? ?PAST MEDICAL HISTORY ?Past Medical History:  ?Diagnosis Date  ? Hypertension   ? Leaky heart valve   ? ?Past Surgical History:  ?Procedure Laterality Date  ? ABDOMINAL HYSTERECTOMY    ? BLADDER SURGERY    ? CATARACT EXTRACTION    ? LEG SURGERY Left   ? ROTATOR CUFF REPAIR Right   ? ? ?FAMILY HISTORY ?No family history on file. ? ?SOCIAL HISTORY ?Social History  ? ?Tobacco Use  ? Smoking status: Never  ? Smokeless tobacco: Never  ?Substance Use Topics  ? Alcohol use: Not Currently  ? Drug use: Not Currently  ? ?  ? ?  ? ?OPHTHALMIC EXAM: ? ?Base Eye Exam   ? ? Visual Acuity (ETDRS)   ? ?  Right Left  ? Dist cc 20/60 -1 CF at 2'  ? Dist ph cc NI   ? ? Correction: Glasses  ? ?  ?  ? ? Tonometry (Tonopen, 1:33 PM)   ? ?   Right Left  ? Pressure 14 12  ? ?  ?  ? ? Pupils   ? ?   Pupils APD  ? Right PERRL None  ? Left PERRL None  ? ?  ?  ? ? Visual Fields   ? ?   Left Right  ?  Full Full  ? ?  ?  ? ? Extraocular Movement   ? ?   Right Left  ?  Full Full  ? ?  ?  ? ? Neuro/Psych   ? ? Oriented x3: Yes  ? Mood/Affect: Normal  ? ?  ?  ? ? Dilation   ? ? Both eyes: 1.0% Mydriacyl, 2.5% Phenylephrine @ 1:33 PM  ? ?  ?  ? ?  ? ?Slit Lamp and Fundus Exam   ? ? External Exam   ? ?   Right Left  ? External Normal Normal  ? ?  ?  ? ? Slit Lamp Exam   ? ?   Right Left  ? Lids/Lashes Normal  Normal  ? Conjunctiva/Sclera White and quiet White and quiet  ? Cornea Clear Clear  ? Anterior Chamber Deep and quiet Deep and quiet  ? Iris Round and reactive Round and reactive  ? Lens Posterior chamber intraocular lens Posterior chamber intraocular lens  ? Anterior Vitreous Normal Normal  ? ?  ?  ? ? Fundus Exam   ? ?   Right Left  ? Posterior Vitreous Posterior vitreous detachment   ? Disc Normal   ? C/D Ratio 0.35   ? Macula Retinal atrophy, Retinal pigment epithelial atrophy - geographic, and macular thickening inferonasal to FAZ OD   ? Vessels Normal   ? Periphery Normal   ? ?  ?  ? ?  ? ? ?IMAGING AND PROCEDURES  ?Imaging and Procedures for 02/13/22 ? ?OCT, Retina - OU - Both Eyes   ? ?   ?Right Eye ?Quality was good. Scan locations included subfoveal. Central Foveal Thickness: 298. Progression has been stable. Findings include subretinal hyper-reflective material, subretinal fluid.  ? ?Left Eye ?Quality was good. Scan locations included subfoveal. Central Foveal Thickness: 380. Progression has improved. Findings include subretinal hyper-reflective material, subretinal scarring, disciform scar.  ? ?Notes ?Central disciform scar left eye continues to remain quiet at 4-monthinterval, we will observe OS today ? ?OD with more recent onset CNVM Findings inferonasal  improved post Avastin  , Currently at 9 weeks  post injection we will repeat injection today, vastly improved as compared to onset September 2022, and stable, repeat injection today ? ?  ? ?Intravitreal Injection, Pharmacologic Agent - OD - Right Eye   ? ?   ?Time Out ?02/13/2022. 1:59 PM. Confirmed correct patient, procedure, site, and patient consented.  ? ?Anesthesia ?Topical anesthesia was used. Anesthetic medications included Lidocaine 4%.  ? ?Procedure ?Preparation included Tobramycin 0.3%, Ofloxacin , 5% betadine to ocular surface, 10% betadine to eyelids. A 30 gauge needle was used.  ? ?Injection: ?2.5 mg bevacizumab 2.5 MG/0.1ML ?  Route:  Intravitreal, Site: Right Eye ?  [place] [id number] Lot:: [id number] ? ?Post-op ?Post injection exam found visual acuity of at least counting fingers. The patient tolerated the procedure well. There were no complications. The patient received  written and verbal post procedure care education. Post injection medications included gentamicin.  ? ?  ? ? ?  ?  ? ?  ?ASSESSMENT/PLAN: ? ?Exudative age-related macular degeneration of right eye with active choroidal neovascularization ([place]) ?Large inferonasal lesion onset September 2022, still in active yet at 9 weeks.  Repeat injection Avastin today to maintain and will maintain 9-week follow-up OU ? ?Exudative age-related macular degeneration of left eye with inactive choroidal neovascularization ([place]) ?OS stable no signs of recurrences  ? ?  ICD-10-CM   ?1. Exudative age-related macular degeneration of right eye with active choroidal neovascularization (HCC)  H35.3211 OCT, Retina - OU - Both Eyes  ?  Intravitreal Injection, Pharmacologic Agent - OD - Right Eye  ?  bevacizumab (AVASTIN) SOSY 2.5 mg  ?  ?2. Exudative age-related macular degeneration of left eye with inactive choroidal neovascularization ([place])  T65.4650   ?  ? ? ?1.  Overall vastly improved oh days since onset of new disease threatening macula,  September 2022, today at 9-week interval with no recurrence, repeat injection today to maintain resolution ? ?2.  Dilate OU next in 9 weeks likely injection again Avastin, OD ? ?3. ? ?Ophthalmic Meds Ordered this visit:  ?Meds ordered this encounter  ?Medications  ? bevacizumab (AVASTIN) SOSY 2.5 mg  ? ? ?  ? ?Return in about 9 weeks (around 04/17/2022) for DILATE OU, OD. ? ?There are no Patient Instructions on file for this visit. ? ? ?Explained the diagnoses, plan, and follow up with the patient and they expressed understanding.  Patient expressed understanding of the importance of proper follow up care.  ? ?Clent Demark. Chantel Teti M.D. ?Diseases & Surgery of the Retina and  Vitreous ?[facility] ?02/13/22 ? ? ? ? ?Abbreviations: ?M myopia (nearsighted); A astigmatism; H hyperopia (farsighted); P presbyopia; Mrx spectacle prescription;  CTL contact lenses; OD righ

## 2022-02-13 NOTE — Assessment & Plan Note (Signed)
Large inferonasal lesion onset September 2022, still in active yet at 9 weeks.  Repeat injection Avastin today to maintain and will maintain 9-week follow-up OU ?

## 2022-04-23 ENCOUNTER — Ambulatory Visit (INDEPENDENT_AMBULATORY_CARE_PROVIDER_SITE_OTHER): Payer: Medicare HMO | Admitting: Ophthalmology

## 2022-04-23 ENCOUNTER — Encounter (INDEPENDENT_AMBULATORY_CARE_PROVIDER_SITE_OTHER): Payer: Self-pay | Admitting: Ophthalmology

## 2022-04-23 DIAGNOSIS — H353211 Exudative age-related macular degeneration, right eye, with active choroidal neovascularization: Secondary | ICD-10-CM

## 2022-04-23 DIAGNOSIS — H353124 Nonexudative age-related macular degeneration, left eye, advanced atrophic with subfoveal involvement: Secondary | ICD-10-CM

## 2022-04-23 DIAGNOSIS — H353113 Nonexudative age-related macular degeneration, right eye, advanced atrophic without subfoveal involvement: Secondary | ICD-10-CM | POA: Diagnosis not present

## 2022-04-23 DIAGNOSIS — H353222 Exudative age-related macular degeneration, left eye, with inactive choroidal neovascularization: Secondary | ICD-10-CM | POA: Diagnosis not present

## 2022-04-23 MED ORDER — BEVACIZUMAB 2.5 MG/0.1ML IZ SOSY
2.5000 mg | PREFILLED_SYRINGE | INTRAVITREAL | Status: AC | PRN
  Administered 2022-04-23: 2.5 mg via INTRAVITREAL

## 2022-04-23 NOTE — Assessment & Plan Note (Signed)
Today at 10-week follow-up, no sign of CNVM active and this monocular patient will continue with treatment

## 2022-04-23 NOTE — Assessment & Plan Note (Signed)
No need for therapy left eye now for 12 months.  Continue to observe, no sign of recurrence of CNVM

## 2022-04-23 NOTE — Assessment & Plan Note (Signed)
We will continue to monitor dry AMD and evaluation for prevention of atrophy progression with use of Syfovre sometime after October 2023

## 2022-04-23 NOTE — Assessment & Plan Note (Signed)
Central geographic atrophy left eye accounts for acuity

## 2022-04-23 NOTE — Progress Notes (Signed)
04/23/2022     CHIEF COMPLAINT Patient presents for  Chief Complaint  Patient presents with   Macular Degeneration      HISTORY OF PRESENT ILLNESS: Raven Sampson is a 88 y.o. female who presents to the clinic today for:   HPI   10 weeks dilate OU, LIKELY injection Avastin OD, OCT. Patient reports vision is stable, but reports she is noticing more floaters in the right eye. Patient states she seems to notice this between each visit, within the week before her visit here it happens every time before her appointment. Patient Allergy to ciprofloxacin. Last edited by Laurin Coder on 04/23/2022  1:04 PM.      Referring physician: Earnie Larsson, PA-C [place] [address],  [place] [address]  HISTORICAL INFORMATION:   Selected notes from the MEDICAL RECORD NUMBER       CURRENT MEDICATIONS: Current Outpatient Medications (Ophthalmic Drugs)  Medication Sig   Polyvinyl Alcohol-Povidone (REFRESH OP) Place 1 drop into both eyes daily.   No current facility-administered medications for this visit. (Ophthalmic Drugs)   Current Outpatient Medications (Other)  Medication Sig   acetaminophen (TYLENOL) 500 MG tablet Take 1,000 mg by mouth every 6 (six) hours as needed for headache (pain).   amLODipine (NORVASC) 5 MG tablet Take 1 tablet (5 mg total) by mouth daily for 14 days.   Calcium Carb-Cholecalciferol (CALCIUM 600-D PO) Take 600 mg by mouth 2 (two) times daily.   Coenzyme Q10 (COQ10 PO) Take 1 capsule by mouth daily.   diclofenac (VOLTAREN) 75 MG EC tablet Take 75 mg by mouth daily as needed (pain).   fexofenadine (ALLEGRA) 180 MG tablet Take 180 mg by mouth daily.   fluocinolone (SYNALAR) 0.01 % external solution Apply 1 application topically See admin instructions. Apply topically to scalp once daily for two weeks, hold for two weeks and then repeat   Glucosamine-Chondroitin (OSTEO BI-FLEX REGULAR STRENGTH PO) Take 1 tablet by mouth 2 (two) times daily.    lisinopril (PRINIVIL,ZESTRIL) 10 MG tablet Take 1 tablet (10 mg total) by mouth daily.   vitamin E 400 UNIT capsule Take 400 Units by mouth daily.   No current facility-administered medications for this visit. (Other)      REVIEW OF SYSTEMS: ROS   Negative for: Constitutional, Gastrointestinal, Neurological, Skin, Genitourinary, Musculoskeletal, HENT, Endocrine, Cardiovascular, Eyes, Respiratory, Psychiatric, Allergic/Imm, Heme/Lymph Last edited by Hurman Horn, MD on 04/23/2022  1:28 PM.       ALLERGIES Allergies  Allergen Reactions   Codeine Nausea And Vomiting and Other (See Comments)    dizziness   Tape Other (See Comments)    Burning sensation - pls use paper tape   Ciprofloxacin Rash   Sulfa Antibiotics Nausea And Vomiting and Rash    PAST MEDICAL HISTORY Past Medical History:  Diagnosis Date   Hypertension    Leaky heart valve    Past Surgical History:  Procedure Laterality Date   ABDOMINAL HYSTERECTOMY     BLADDER SURGERY     CATARACT EXTRACTION     LEG SURGERY Left    ROTATOR CUFF REPAIR Right     FAMILY HISTORY History reviewed. No pertinent family history.  SOCIAL HISTORY Social History   Tobacco Use   Smoking status: Never   Smokeless tobacco: Never  Substance Use Topics   Alcohol use: Not Currently   Drug use: Not Currently         OPHTHALMIC EXAM:  Base Eye Exam  Visual Acuity (ETDRS)       Right Left   Dist [place] 20/60 CF at 2'   Dist ph [place] NI NI         Tonometry (Tonopen, 1:06 PM)       Right Left   Pressure 18 18         Pupils       Pupils Dark Light APD   Right PERRL 5 4 None   Left PERRL 5 4 None         Extraocular Movement       Right Left    Full Full         Neuro/Psych     Oriented x3: Yes   Mood/Affect: Normal         Dilation     Both eyes: 1.0% Mydriacyl, 2.5% Phenylephrine @ 1:06 PM           Slit Lamp and Fundus Exam     External Exam       Right Left   External Normal  Normal         Slit Lamp Exam       Right Left   Lids/Lashes Normal Normal   Conjunctiva/Sclera White and quiet White and quiet   Cornea Clear Clear   Anterior Chamber Deep and quiet Deep and quiet   Iris Round and reactive Round and reactive   Lens Posterior chamber intraocular lens Posterior chamber intraocular lens   Anterior Vitreous Normal Normal         Fundus Exam       Right Left   Posterior Vitreous Posterior vitreous detachment Posterior vitreous detachment   Disc Normal Normal   C/D Ratio 0.35 0.4   Macula Retinal atrophy, Retinal pigment epithelial atrophy - geographic, and macular thickening inferonasal to FAZ OD Drus[place], Pigmented atrophy, Advanced age related macular degeneration, Retinal pigment epithelial mottling, no exudates, Disciform scar no hemorrhage, no macular thickening, no membrane   Vessels Normal Normal   Periphery Normal Normal            IMAGING AND PROCEDURES  Imaging and Procedures for 04/23/22  Intravitreal Injection, Pharmacologic Agent - OD - Right Eye       Time Out 04/23/2022. 1:29 PM. Confirmed correct patient, procedure, site, and patient consented.   Anesthesia Topical anesthesia was used. Anesthetic medications included Lidocaine 4%.   Procedure Preparation included Tobramycin 0.3%, Ofloxacin , 5% betadine to ocular surface, 10% betadine to eyelids. A 30 gauge needle was used.   Injection: 2.5 mg bevacizumab 2.5 MG/0.1ML   Route: Intravitreal, Site: Right Eye   NDC: [phone], Lot: [id number], Expiration date: 06/17/2022   Post-op Post injection exam found visual acuity of at least counting fingers. The patient tolerated the procedure well. There were no complications. The patient received written and verbal post procedure care education. Post injection medications included gentamicin.      OCT, Retina - OU - Both Eyes       Right Eye Quality was good. Scan locations included subfoveal. Central Foveal Thickness:  297. Progression has been stable. Findings include subretinal hyper-reflective material, subretinal fluid.   Left Eye Quality was good. Scan locations included subfoveal. Central Foveal Thickness: 377. Progression has improved. Findings include subretinal hyper-reflective material, subretinal scarring, disciform scar.   Notes Central disciform scar left eye continues to remain quiet at  12 month interval, we will observe OS today  OD with more recent onset CNVM Findings inferonasal  improved post  Avastin  , Currently at 10  weeks  post injection we will repeat injection today, vastly improved as compared to onset September 2022, and stable, repeat injection today             ASSESSMENT/PLAN:  Exudative age-related macular degeneration of left eye with inactive choroidal neovascularization ([place]) No need for therapy left eye now for 12 months.  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 Exudative age-related macular degeneration of right eye with active choroidal neovascularization (HCC)  H35.3211 Intravitreal Injection, Pharmacologic Agent - OD - Right Eye    OCT, Retina - OU - Both Eyes    bevacizumab (AVASTIN) SOSY 2.5 mg    2. Exudative age-related macular degeneration of left eye with inactive choroidal neovascularization ([place])  H35.3222     3. Advanced  nonexudative age-related macular degeneration of left eye with subfoveal involvement  H35.3124     4. Advanced nonexudative age-related macular degeneration of right eye without subfoveal involvement  H35.3113       1.  OD, vastly improved since onset of therapy September 2022.  Now at 10-week follow-up interval doing very well.  No sign of recurrence of CNVM on Avastin.  Repeat injection today  2.  Dilate OU next in 11 weeks likely injection Avastin OCT OD.  May be a candidate OD for Syfovre to prevent progression of geographic atrophy will discuss next visit  3.  Ophthalmic Meds Ordered this visit:  Meds ordered this encounter  Medications   bevacizumab (AVASTIN) SOSY 2.5 mg       Return in about 11 weeks (around 07/09/2022) for DILATE OU, AVASTIN OCT, OD.  There are no Patient Instructions on file for this visit.   Explained the diagnoses, plan, and follow up with the patient and they expressed understanding.  Patient expressed understanding of the importance of proper follow up care.   Clent Demark Deshon Hsiao M.D. Diseases & Surgery of the Retina and Vitreous Retina & Diabetic [place] 04/23/22     Abbreviations: M myopia (nearsighted); A astigmatism; H hyperopia (farsighted); P presbyopia; Mrx spectacle prescription;  CTL contact lenses; OD right eye; OS left eye; OU both eyes  XT exotropia; ET esotropia; PEK punctate epithelial keratitis; PEE punctate epithelial erosions; DES dry eye syndrome; MGD meibomian gland dysfunction; ATs artificial tears; PFAT's preservative free artificial tears; [place] nuclear sclerotic cataract; PSC posterior subcapsular cataract; ERM epi-retinal membrane; PVD posterior vitreous detachment; RD retinal detachment; DM diabetes mellitus; DR diabetic retinopathy; NPDR non-proliferative diabetic retinopathy; PDR proliferative diabetic retinopathy; CSME clinically significant macular edema; DME diabetic macular edema; dbh dot blot hemorrhages; CWS cotton wool  spot; POAG primary open angle glaucoma; C/D cup-to-disc ratio; HVF humphrey visual field; GVF goldmann visual field; OCT optical coherence tomography; IOP intraocular pressure; BRVO Branch retinal vein occlusion; CRVO central retinal vein occlusion; CRAO central retinal artery occlusion; BRAO branch retinal artery occlusion; RT retinal tear; SB scleral buckle; PPV pars plana vitrectomy; VH Vitreous hemorrhage;  PRP panretinal laser photocoagulation; IVK intravitreal kenalog; VMT vitreomacular traction; MH Macular hole;  NVD neovascularization of the disc; NVE neovascularization elsewhere; AREDS age related eye disease study; ARMD age related macular degeneration; POAG primary open angle glaucoma; EBMD epithelial/anterior basement membrane dystrophy; ACIOL anterior chamber intraocular lens; IOL intraocular lens; PCIOL posterior chamber intraocular lens; Phaco/IOL phacoemulsification with intraocular lens placement; [place] photorefractive keratectomy; LASIK laser assisted in situ keratomileusis; HTN hypertension; DM diabetes mellitus; COPD chronic obstructive pulmonary disease

## 2022-07-04 ENCOUNTER — Ambulatory Visit (INDEPENDENT_AMBULATORY_CARE_PROVIDER_SITE_OTHER): Payer: Medicare HMO | Admitting: Ophthalmology

## 2022-07-04 ENCOUNTER — Encounter (INDEPENDENT_AMBULATORY_CARE_PROVIDER_SITE_OTHER): Payer: Self-pay | Admitting: Ophthalmology

## 2022-07-04 DIAGNOSIS — H353124 Nonexudative age-related macular degeneration, left eye, advanced atrophic with subfoveal involvement: Secondary | ICD-10-CM

## 2022-07-04 DIAGNOSIS — H353113 Nonexudative age-related macular degeneration, right eye, advanced atrophic without subfoveal involvement: Secondary | ICD-10-CM | POA: Diagnosis not present

## 2022-07-04 DIAGNOSIS — H353211 Exudative age-related macular degeneration, right eye, with active choroidal neovascularization: Secondary | ICD-10-CM

## 2022-07-04 MED ORDER — BEVACIZUMAB CHEMO INJECTION 1.25MG/0.05ML SYRINGE FOR KALEIDOSCOPE
1.2500 mg | INTRAVITREAL | Status: AC | PRN
  Administered 2022-07-04: 1.25 mg via INTRAVITREAL

## 2022-07-04 NOTE — Assessment & Plan Note (Signed)
Significant foveal atrophy accounts for acuity

## 2022-07-04 NOTE — Assessment & Plan Note (Signed)
OD doing very well.  Much less thickening no sign of hemorrhage.  Currently 11-week follow-up post Avastin.  We will repeat injection today and reevaluate again in 3 months

## 2022-07-04 NOTE — Progress Notes (Signed)
07/04/2022     CHIEF COMPLAINT Patient presents for  Chief Complaint  Patient presents with   Macular Degeneration      HISTORY OF PRESENT ILLNESS: Raven Sampson is a 88 y.o. female who presents to the clinic today for:   HPI   11 weeks dilate ou avastin oct od Pt states her vision has been stable Pt denies any new floaters or FOL Pt states the vision out of her left eye is not as gray as it used to be Last edited by Morene Rankins, CMA on 07/04/2022  1:59 PM.      Referring physician: Earnie Larsson, PA-C [address],  [place] [address]  HISTORICAL INFORMATION:   Selected notes from the [facility]: Current Outpatient Medications (Ophthalmic Drugs)  Medication Sig   Polyvinyl Alcohol-Povidone (REFRESH OP) Place 1 drop into both eyes daily.   No current facility-administered medications for this visit. (Ophthalmic Drugs)   Current Outpatient Medications (Other)  Medication Sig   acetaminophen (TYLENOL) 500 MG tablet Take 1,000 mg by mouth every 6 (six) hours as needed for headache (pain).   amLODipine (NORVASC) 5 MG tablet Take 1 tablet (5 mg total) by mouth daily for 14 days.   Calcium Carb-Cholecalciferol (CALCIUM 600-D PO) Take 600 mg by mouth 2 (two) times daily.   Coenzyme Q10 (COQ10 PO) Take 1 capsule by mouth daily.   diclofenac (VOLTAREN) 75 MG EC tablet Take 75 mg by mouth daily as needed (pain).   fexofenadine (ALLEGRA) 180 MG tablet Take 180 mg by mouth daily.   fluocinolone (SYNALAR) 0.01 % external solution Apply 1 application topically See admin instructions. Apply topically to scalp once daily for two weeks, hold for two weeks and then repeat   Glucosamine-Chondroitin (OSTEO BI-FLEX REGULAR STRENGTH PO) Take 1 tablet by mouth 2 (two) times daily.   lisinopril (PRINIVIL,ZESTRIL) 10 MG tablet Take 1 tablet (10 mg total) by mouth daily.   vitamin E 400 UNIT capsule Take 400 Units by mouth  daily.   No current facility-administered medications for this visit. (Other)      REVIEW OF SYSTEMS: ROS   Negative for: Constitutional, Gastrointestinal, Neurological, Skin, Genitourinary, Musculoskeletal, HENT, Endocrine, Cardiovascular, Eyes, Respiratory, Psychiatric, Allergic/Imm, Heme/Lymph Last edited by Morene Rankins, CMA on 07/04/2022  1:59 PM.       ALLERGIES Allergies  Allergen Reactions   Codeine Nausea And Vomiting and Other (See Comments)    dizziness   Tape Other (See Comments)    Burning sensation - pls use paper tape   Ciprofloxacin Rash   Sulfa Antibiotics Nausea And Vomiting and Rash    PAST MEDICAL HISTORY Past Medical History:  Diagnosis Date   Hypertension    Leaky heart valve    Past Surgical History:  Procedure Laterality Date   ABDOMINAL HYSTERECTOMY     BLADDER SURGERY     CATARACT EXTRACTION     LEG SURGERY Left    ROTATOR CUFF REPAIR Right     FAMILY HISTORY History reviewed. No pertinent family history.  SOCIAL HISTORY Social History   Tobacco Use   Smoking status: Never   Smokeless tobacco: Never  Substance Use Topics   Alcohol use: Not Currently   Drug use: Not Currently         OPHTHALMIC EXAM:  Base Eye Exam     Visual Acuity (ETDRS)       Right  Left   Dist [place] 20/60 CF at 2'    Correction: Glasses         Tonometry (Tonopen, 2:02 PM)       Right Left   Pressure 10 8         Pupils       Pupils Shape APD   Right PERRL Round None   Left PERRL Round None         Visual Fields       Left Right     Full   Restrictions Partial inner superior temporal, inferior temporal, superior nasal, inferior nasal deficiencies          Extraocular Movement       Right Left    Full, Ortho Full, Ortho         Neuro/Psych     Oriented x3: Yes   Mood/Affect: Normal         Dilation     Both eyes: 1.0% Mydriacyl, 2.5% Phenylephrine @ 2:01 PM           Slit Lamp and Fundus Exam      External Exam       Right Left   External Normal Normal         Slit Lamp Exam       Right Left   Lids/Lashes Normal Normal   Conjunctiva/Sclera White and quiet White and quiet   Cornea Clear Clear   Anterior Chamber Deep and quiet Deep and quiet   Iris Round and reactive Round and reactive   Lens Posterior chamber intraocular lens Posterior chamber intraocular lens   Anterior Vitreous Normal Normal         Fundus Exam       Right Left   Posterior Vitreous Posterior vitreous detachment Posterior vitreous detachment   Disc Normal Normal   C/D Ratio 0.35 0.4   Macula Retinal atrophy, Retinal pigment epithelial atrophy - geographic, and macular thickening inferonasal to FAZ OD [place], Pigmented atrophy, Advanced age related macular degeneration, Retinal pigment epithelial mottling, no exudates, Disciform scar no hemorrhage, no macular thickening, no membrane   Vessels Normal Normal   Periphery Normal Normal            IMAGING AND PROCEDURES  Imaging and Procedures for 07/04/22  OCT, Retina - OU - Both Eyes       Right Eye Quality was good. Scan locations included subfoveal. Central Foveal Thickness: 291. Progression has been stable. Findings include subretinal hyper-reflective material, subretinal fluid.   Left Eye Quality was good. Scan locations included subfoveal. Central Foveal Thickness: 377. Progression has improved. Findings include subretinal hyper-reflective material, disciform scar, subretinal scarring.   Notes Central disciform scar left eye continues to remain quiet at  15 month interval, we will observe OS today  OD with more recent onset CNVM Findings inferonasal  improved post Avastin  , Currently at 11  weeks  post injection we will repeat injection today, vastly improved as compared to onset September 2022, and stable, repeat injection today     Intravitreal Injection, Pharmacologic Agent - OD - Right Eye       Time Out 07/04/2022. 3:42 PM.  Confirmed correct patient, procedure, site, and patient consented.   Anesthesia Topical anesthesia was used. Anesthetic medications included Lidocaine 4%.   Procedure Preparation included 5% betadine to ocular surface, 10% betadine to eyelids, Tobramycin 0.3%, Ofloxacin . A 30 gauge needle was used.   Injection: 1.25 mg Bevacizumab 1.'25mg'$ /0.69m   Route:  Intravitreal, Site: Right Eye   NDC: [id number], Lot: [id number], Expiration date: 10/03/2022   Post-op Post injection exam found visual acuity of at least counting fingers. The patient tolerated the procedure well. There were no complications. The patient received written and verbal post procedure care education. Post injection medications included gentamicin.              ASSESSMENT/PLAN:  Exudative age-related macular degeneration of right eye with active choroidal neovascularization ([place]) OD doing very well.  Much less thickening no sign of hemorrhage.  Currently 11-week follow-up post Avastin.  We will repeat injection today and reevaluate again in 3 months  Advanced nonexudative age-related macular degeneration of left eye with subfoveal involvement Significant foveal atrophy accounts for acuity  Advanced nonexudative age-related macular degeneration of right eye without subfoveal involvement Perifoveal atrophy OD may discuss Syfovre in the future     ICD-10-CM   1. Exudative age-related macular degeneration of right eye with active choroidal neovascularization (HCC)  H35.3211 OCT, Retina - OU - Both Eyes    Intravitreal Injection, Pharmacologic Agent - OD - Right Eye    Bevacizumab (AVASTIN) SOLN 1.25 mg    2. Advanced nonexudative age-related macular degeneration of left eye with subfoveal involvement  H35.3124     3. Advanced nonexudative age-related macular degeneration of right eye without subfoveal involvement  H35.3113       1.  Repeat injection intravitreal Avastin today 11-week interval to maintain current  status and prevent vision loss.  Follow-up next in 3 months for maintenance evaluation dilate OU next  2.  3.  Ophthalmic Meds Ordered this visit:  Meds ordered this encounter  Medications   Bevacizumab (AVASTIN) SOLN 1.25 mg       Return in about 3 months (around 10/04/2022) for DILATE OU, AVASTIN OCT, OD.  There are no Patient Instructions on file for this visit.   Explained the diagnoses, plan, and follow up with the patient and they expressed understanding.  Patient expressed understanding of the importance of proper follow up care.   Clent Demark Jaycelynn Knickerbocker M.D. Diseases & Surgery of the Retina and Vitreous Retina & Diabetic [place] 07/04/22     Abbreviations: M myopia (nearsighted); A astigmatism; H hyperopia (farsighted); P presbyopia; Mrx spectacle prescription;  CTL contact lenses; OD right eye; OS left eye; OU both eyes  XT exotropia; ET esotropia; PEK punctate epithelial keratitis; PEE punctate epithelial erosions; DES dry eye syndrome; MGD meibomian gland dysfunction; ATs artificial tears; PFAT's preservative free artificial tears; [place] nuclear sclerotic cataract; PSC posterior subcapsular cataract; ERM epi-retinal membrane; PVD posterior vitreous detachment; RD retinal detachment; DM diabetes mellitus; DR diabetic retinopathy; NPDR non-proliferative diabetic retinopathy; PDR proliferative diabetic retinopathy; CSME clinically significant macular edema; DME diabetic macular edema; dbh dot blot hemorrhages; CWS cotton wool spot; POAG primary open angle glaucoma; C/D cup-to-disc ratio; HVF humphrey visual field; GVF goldmann visual field; OCT optical coherence tomography; IOP intraocular pressure; BRVO Branch retinal vein occlusion; CRVO central retinal vein occlusion; CRAO central retinal artery occlusion; BRAO branch retinal artery occlusion; RT retinal tear; SB scleral buckle; PPV pars plana vitrectomy; VH Vitreous hemorrhage; PRP panretinal laser photocoagulation; IVK  intravitreal kenalog; VMT vitreomacular traction; MH Macular hole;  NVD neovascularization of the disc; NVE neovascularization elsewhere; AREDS age related eye disease study; ARMD age related macular degeneration; POAG primary open angle glaucoma; EBMD epithelial/anterior basement membrane dystrophy; ACIOL anterior chamber intraocular lens; IOL intraocular lens; PCIOL posterior chamber intraocular lens; Phaco/IOL phacoemulsification with intraocular lens placement; PRK photorefractive  keratectomy; LASIK laser assisted in situ keratomileusis; HTN hypertension; DM diabetes mellitus; COPD chronic obstructive pulmonary disease

## 2022-07-04 NOTE — Assessment & Plan Note (Signed)
Perifoveal atrophy OD may discuss Syfovre in the future

## 2022-07-09 ENCOUNTER — Encounter (INDEPENDENT_AMBULATORY_CARE_PROVIDER_SITE_OTHER): Payer: Medicare HMO | Admitting: Ophthalmology

## 2022-10-08 ENCOUNTER — Encounter (INDEPENDENT_AMBULATORY_CARE_PROVIDER_SITE_OTHER): Payer: Medicare HMO | Admitting: Ophthalmology

## 2023-02-14 ENCOUNTER — Other Ambulatory Visit: Payer: Self-pay

## 2023-02-14 ENCOUNTER — Emergency Department (HOSPITAL_BASED_OUTPATIENT_CLINIC_OR_DEPARTMENT_OTHER)
Admission: EM | Admit: 2023-02-14 | Discharge: 2023-02-14 | Discharge status: Against Medical Advice | Payer: Medicare HMO | Attending: Emergency Medicine | Admitting: Emergency Medicine

## 2023-02-14 ENCOUNTER — Emergency Department (HOSPITAL_BASED_OUTPATIENT_CLINIC_OR_DEPARTMENT_OTHER): Payer: Medicare HMO

## 2023-02-14 ENCOUNTER — Encounter (HOSPITAL_BASED_OUTPATIENT_CLINIC_OR_DEPARTMENT_OTHER): Payer: Self-pay | Admitting: Emergency Medicine

## 2023-02-14 DIAGNOSIS — E871 Hypo-osmolality and hyponatremia: Secondary | ICD-10-CM | POA: Insufficient documentation

## 2023-02-14 DIAGNOSIS — I10 Essential (primary) hypertension: Secondary | ICD-10-CM | POA: Diagnosis not present

## 2023-02-14 DIAGNOSIS — Z5329 Procedure and treatment not carried out because of patient's decision for other reasons: Secondary | ICD-10-CM | POA: Diagnosis not present

## 2023-02-14 DIAGNOSIS — R39198 Other difficulties with micturition: Secondary | ICD-10-CM | POA: Diagnosis present

## 2023-02-14 LAB — URINALYSIS, MICROSCOPIC (REFLEX): WBC, UA: NONE SEEN WBC/hpf (ref 0–5)

## 2023-02-14 LAB — COMPREHENSIVE METABOLIC PANEL
ALT: 41 U/L (ref 0–44)
AST: 39 U/L (ref 15–41)
Albumin: 3.5 g/dL (ref 3.5–5.0)
Alkaline Phosphatase: 81 U/L (ref 38–126)
Anion gap: 9 (ref 5–15)
BUN: 20 mg/dL (ref 8–23)
CO2: 22 mmol/L (ref 22–32)
Calcium: 8.5 mg/dL — ABNORMAL LOW (ref 8.9–10.3)
Chloride: 89 mmol/L — ABNORMAL LOW (ref 98–111)
Creatinine, Ser: 0.58 mg/dL (ref 0.44–1.00)
GFR, Estimated: >60 mL/min (ref >60)
Glucose, Bld: 115 mg/dL — ABNORMAL HIGH (ref 70–99)
Potassium: 3.7 mmol/L (ref 3.5–5.1)
Sodium: 120 mmol/L — ABNORMAL LOW (ref 135–145)
Total Bilirubin: 0.3 mg/dL (ref 0.3–1.2)
Total Protein: 6.8 g/dL (ref 6.5–8.1)

## 2023-02-14 LAB — CBC WITH DIFFERENTIAL/PLATELET
Abs Immature Granulocytes: 0.03 10*3/uL (ref 0.00–0.07)
Basophils Absolute: 0 10*3/uL (ref 0.0–0.1)
Basophils Relative: 1 %
Eosinophils Absolute: 0.1 10*3/uL (ref 0.0–0.5)
Eosinophils Relative: 1 %
HCT: 35.7 % — ABNORMAL LOW (ref 36.0–46.0)
Hemoglobin: 12.6 g/dL (ref 12.0–15.0)
Immature Granulocytes: 1 %
Lymphocytes Relative: 43 %
Lymphs Abs: 2.3 10*3/uL (ref 0.7–4.0)
MCH: 30.2 pg (ref 26.0–34.0)
MCHC: 35.3 g/dL (ref 30.0–36.0)
MCV: 85.6 fL (ref 80.0–100.0)
Monocytes Absolute: 0.9 10*3/uL (ref 0.1–1.0)
Monocytes Relative: 17 %
Neutro Abs: 1.9 10*3/uL (ref 1.7–7.7)
Neutrophils Relative %: 37 %
Platelets: 196 10*3/uL (ref 150–400)
RBC: 4.17 MIL/uL (ref 3.87–5.11)
RDW: 14.3 % (ref 11.5–15.5)
WBC: 5.1 10*3/uL (ref 4.0–10.5)
nRBC: 0 % (ref 0.0–0.2)

## 2023-02-14 LAB — URINALYSIS, ROUTINE W REFLEX MICROSCOPIC
Bilirubin Urine: NEGATIVE
Glucose, UA: NEGATIVE mg/dL
Ketones, ur: NEGATIVE mg/dL
Leukocytes,Ua: NEGATIVE
Nitrite: NEGATIVE
Protein, ur: NEGATIVE mg/dL
Specific Gravity, Urine: 1.01 (ref 1.005–1.030)
pH: 6.5 (ref 5.0–8.0)

## 2023-02-14 MED ORDER — SODIUM CHLORIDE 0.9 % IV SOLN: Freq: Once | INTRAVENOUS | Status: AC

## 2023-02-14 NOTE — ED Notes (Signed)
Bladder was 143ml.

## 2023-02-14 NOTE — ED Notes (Signed)
Discharge paperwork reviewed entirely with patient, including Rx's and follow up care. Pain was under control. Pt understood that she was being discharged per her request, but Against Medical Advice of the EDP. The pt understood that her Sodium was critically low and of notable concern, and the recommendation was to be admitted. Pt understood there are significant risks with not having her Sodium fixed, that include but are not limited due, confusion, seizures, and death. No questions or concerns voiced at the time of discharge. No acute distress noted. Pt's grandson was with the patient and advised he would keep her at his house for the holiday weekend and attempt to get her to the a PCP to have labs redrawn ASAP on Monday. Grandson was informed to have her consume electrolyte solutions for salt replinishment (Gatorade, Powerade, Pedialyte, etc). And he would obtain them for her.   Pt ambulated out to [facility] without incident or assistance.

## 2023-02-14 NOTE — ED Notes (Signed)
EDP advised of Na of 120.

## 2023-02-14 NOTE — Discharge Instructions (Signed)
You were seen in the emergency room today with weakness and low sodium.  I had significant concern with how low your sodium had become and we discussed that staying in the hospital was the best course of action.  You are choosing to leave [facility] but please understand that if you begin to feel worse over the weekend you need to return to the emergency department immediately or call 911 for assessment.  Please call your doctors office first thing tomorrow morning for the next available lab draw and follow up appointment.

## 2023-02-14 NOTE — ED Triage Notes (Signed)
Placed on meds for shingles and  sinus infection yesterday and now it hard for her to void ,  kefles and acylovir

## 2023-02-14 NOTE — ED Notes (Signed)
Per EDP- Dr. Hanley Ben transport has been canceled as patient will be leaving AMA

## 2023-02-14 NOTE — ED Provider Notes (Signed)
Emergency Department Provider Note   I have reviewed the triage vital signs and the nursing notes.   HISTORY  Chief Complaint Medication Reaction   HPI Raven Sampson is a 89 y.o. female past history of hypertension presents emergency department with concern for possible medication reaction.  She is being treated by her primary care physician for shingles along with antibiotics for sinusitis.  She completed a course of steroids earlier in the week.  She saw her PCP with cough, congestion, and zoster rash.  She states that this morning she had some stomach upset and diarrhea but had significant difficulty with urination.  She did not have to urinate until around noon today which is unusual.  She went approximately 1 hour before she got here and does feel the urge to urinate here.  No dysuria, hesitancy, urgency.  No fevers or chills.  Denies any chest or abdominal pain.   Past Medical History:  Diagnosis Date   Hypertension    Leaky heart valve     Review of Systems  Constitutional: No fever/chills Cardiovascular: Denies chest pain. Respiratory: Denies shortness of breath. Gastrointestinal: No abdominal pain.  No nausea, no vomiting. Positive diarrhea.  No constipation.  Genitourinary: Negative for dysuria. Positive urinary retention.  Musculoskeletal: Negative for back pain. Skin: Negative for rash. Neurological: Negative for headaches.   ____________________________________________   PHYSICAL EXAM:  VITAL SIGNS: ED Triage Vitals  Enc Vitals Group     BP 02/14/23 1727 134/82     Pulse Rate 02/14/23 1727 100     Resp 02/14/23 1727 20     Temp 02/14/23 1727 98.2 F (36.8 C)     Temp Source 02/14/23 1727 Oral     SpO2 02/14/23 1727 95 %     Weight 02/14/23 1726 138 lb 14.2 oz (63 kg)     Height 02/14/23 1726 5\' 6"  (1.676 m)    Constitutional: Alert and oriented. Well appearing and in no acute distress. Eyes: Conjunctivae are normal.  Head: Atraumatic. Nose: No  congestion/rhinnorhea. Mouth/Throat: Mucous membranes are moist.  Neck: No stridor.  Cardiovascular: Normal rate, regular rhythm. Good peripheral circulation. Grossly normal heart sounds.   Respiratory: Normal respiratory effort.  No retractions. Lungs CTAB. Gastrointestinal: Soft and nontender. No distention.  Musculoskeletal: No lower extremity tenderness nor edema. No gross deformities of extremities. Neurologic:  Normal speech and language. Ambulatory with her walker here.  Skin:  Skin is warm and dry with right flank rash.    ____________________________________________   LABS (all labs ordered are listed, but only abnormal results are displayed)  Labs Reviewed  COMPREHENSIVE METABOLIC PANEL - Abnormal; Notable for the following components:      Result Value   Sodium 120 (*)    Chloride 89 (*)    Glucose, Bld 115 (*)    Calcium 8.5 (*)    All other components within normal limits  CBC WITH DIFFERENTIAL/PLATELET - Abnormal; Notable for the following components:   HCT 35.7 (*)    All other components within normal limits  URINALYSIS, ROUTINE W REFLEX MICROSCOPIC - Abnormal; Notable for the following components:   Hgb urine dipstick TRACE (*)    All other components within normal limits  URINALYSIS, MICROSCOPIC (REFLEX) - Abnormal; Notable for the following components:   Bacteria, UA RARE (*)    All other components within normal limits   ____________________________________________  RADIOLOGY  DG Chest 2 View  Result Date: 02/14/2023 CLINICAL DATA:  Cough. EXAM: CHEST - 2 VIEW  COMPARISON:  05/24/2021 FINDINGS: The cardiac silhouette, mediastinal and hilar contours are normal. The lungs are clear. No pleural effusion. No pulmonary lesions. No pneumothorax. The bony thorax is intact. IMPRESSION: No acute cardiopulmonary findings. Electronically Signed   By: Marijo Sanes M.D.   On: 02/14/2023 18:10     ____________________________________________   PROCEDURES  Procedure(s) performed:   Procedures  None  ____________________________________________   INITIAL IMPRESSION / ASSESSMENT AND PLAN / ED COURSE  Pertinent labs & imaging results that were available during my care of the patient were reviewed by me and considered in my medical decision making (see chart for details).   This patient is Presenting for Evaluation of diarrhea/urinary retention, which does require a range of treatment options, and is a complaint that involves a high risk of morbidity and mortality.  The Differential Diagnoses include medication reaction, medication intolerance, colitis, spine injury, etc.  Critical Interventions-    Medications  0.9 %  sodium chloride infusion ( Intravenous New Bag/Given 02/14/23 1837)    Reassessment after intervention: Symptoms improved.    I did obtain Additional Historical Information from son at bedside.     Clinical Laboratory Tests Ordered, included sodium of 120.  In comparison to prior labs she typically has hyponatremia but in the 130 range.  No anemia or leukocytosis.  No evidence of urinary tract infection.  Radiologic Tests Ordered, included CXR. I independently interpreted the images and agree with radiology interpretation.   Cardiac Monitor Tracing which shows NSR.   Consult complete with Hospitalist. Plan for admit. They will place a bed order for transport.   Medical Decision Making: Summary:  Patient presents emergency department with concern for difficulty with urination.  No specific UTI symptoms.  More retention type symptoms.  She was started on antivirals along with Keflex and Phenergan cough syrup.  Suspect that if anything the Phenergan cough syrup may be contributing to some mild urinary retention.  She is able to urinate here in the ER.  Plan for screening lab work, UA, chest x-ray given cough and assess for urinary retention but do not see an  indication for Foley catheter at this time as she is able to urinate currently. Cautioned against using phenergan cough syrup further.   Reevaluation with update and discussion with patient and grandson at bedside.  Patient agrees to admit, IV fluids, etc.   07:45 PM  Called back to patient bedside.  She remains there with her grandson and tells me that she no longer wants to be admitted.  We discussed her symptoms, her low sodium, and that if it continues to drift lower over the weekend she can become increasingly confused and possibly have seizure eating to significant disability or even death.  She tells me that she understands this.  She says "I am 89 years old and I do not want to be in the hospital."  Yolanda Bonine is at bedside participating in this conversation.  He tells me that she will stay with him and he can watch her and bring her back if things worsen.  I discussed that if she does have worsening sodium over the weekend the hospitalization will likely be longer and potentially more complicated.  She understands this.  She does not seem confused on my assessment.  She does appear to have capacity to make this decision.  She is able to restate my concern and understands the potential harm with leaving [place] but is choosing to do so.  We discussed that  she is free to return at any time should she change her mind or feel worse.  She verbalized understanding of this.  Considered admission but patient ultimately decided to leave [place].  Patient's presentation is most consistent with acute presentation with potential threat to life or bodily function.   Disposition: AMA  ____________________________________________  FINAL CLINICAL IMPRESSION(S) / ED DIAGNOSES  Final diagnoses:  Hyponatremia    Note:  This document was prepared using Dragon voice recognition software and may include unintentional dictation errors.  Nanda Quinton, MD, [facility]    Mickenzie Stolar, [facility], MD 02/14/23 [phone]

## 2024-11-25 ENCOUNTER — Encounter: Payer: Self-pay | Admitting: Family Medicine

## 2024-11-25 ENCOUNTER — Other Ambulatory Visit: Payer: Self-pay | Admitting: Family Medicine

## 2024-11-25 DIAGNOSIS — R071 Chest pain on breathing: Secondary | ICD-10-CM

## 2024-11-25 DIAGNOSIS — R1031 Right lower quadrant pain: Secondary | ICD-10-CM

## 2024-11-25 DIAGNOSIS — R0789 Other chest pain: Secondary | ICD-10-CM

## 2024-11-25 NOTE — Progress Notes (Signed)
 H/o RLQ abd pain and new pleuritic pain not entirely explained by infectious process.  CT chest and CT abd/plv.

## 2024-12-03 ENCOUNTER — Encounter: Payer: Self-pay | Admitting: Family Medicine

## 2024-12-04 ENCOUNTER — Other Ambulatory Visit

## 2024-12-04 ENCOUNTER — Ambulatory Visit
Admission: RE | Admit: 2024-12-04 | Discharge: 2024-12-04 | Disposition: A | Discharge status: Home / Self Care | Source: Ambulatory Visit | Attending: Family Medicine | Admitting: Family Medicine

## 2024-12-04 DIAGNOSIS — R1031 Right lower quadrant pain: Secondary | ICD-10-CM

## 2024-12-17 ENCOUNTER — Encounter: Payer: Self-pay | Admitting: Family Medicine

## 2024-12-30 ENCOUNTER — Other Ambulatory Visit
